# Patient Record
Sex: Male | Born: 1940
Health system: Southern US, Community
[De-identification: ages and names within clinical notes are randomized; demographics above are authoritative.]

## PROBLEM LIST (undated history)

## (undated) DIAGNOSIS — E785 Hyperlipidemia, unspecified: Secondary | ICD-10-CM

## (undated) DIAGNOSIS — Z9189 Other specified personal risk factors, not elsewhere classified: Secondary | ICD-10-CM

## (undated) DIAGNOSIS — Z9289 Personal history of other medical treatment: Secondary | ICD-10-CM

## (undated) DIAGNOSIS — G43909 Migraine, unspecified, not intractable, without status migrainosus: Secondary | ICD-10-CM

## (undated) DIAGNOSIS — M1990 Unspecified osteoarthritis, unspecified site: Secondary | ICD-10-CM

## (undated) DIAGNOSIS — E079 Disorder of thyroid, unspecified: Secondary | ICD-10-CM

## (undated) HISTORY — PX: ROTATOR CUFF REPAIR: SHX139

## (undated) HISTORY — PX: TREATMENT FISTULA ANAL: SUR1390

## (undated) HISTORY — DX: Unspecified osteoarthritis, unspecified site: M19.90

## (undated) HISTORY — DX: Migraine, unspecified, not intractable, without status migrainosus: G43.909

## (undated) HISTORY — PX: EYE SURGERY: SHX253

## (undated) HISTORY — PX: SHOULDER SURGERY: SHX246

## (undated) HISTORY — DX: Personal history of other medical treatment: Z92.89

## (undated) HISTORY — DX: Other specified personal risk factors, not elsewhere classified: Z91.89

## (undated) HISTORY — DX: Hyperlipidemia, unspecified: E78.5

## (undated) HISTORY — DX: Disorder of thyroid, unspecified: E07.9

---

## 2009-09-06 ENCOUNTER — Encounter: Admission: RE | Admit: 2009-09-06 | Discharge: 2009-09-06 | Payer: Self-pay | Admitting: Family Medicine

## 2013-05-30 ENCOUNTER — Other Ambulatory Visit: Payer: Self-pay | Admitting: Physician Assistant

## 2013-05-30 DIAGNOSIS — R1013 Epigastric pain: Secondary | ICD-10-CM

## 2013-05-31 ENCOUNTER — Ambulatory Visit
Admission: RE | Admit: 2013-05-31 | Discharge: 2013-05-31 | Disposition: A | Discharge status: Home / Self Care | Payer: 59 | Source: Ambulatory Visit | Attending: Physician Assistant | Admitting: Physician Assistant

## 2013-05-31 DIAGNOSIS — R1013 Epigastric pain: Secondary | ICD-10-CM

## 2013-06-07 ENCOUNTER — Ambulatory Visit (INDEPENDENT_AMBULATORY_CARE_PROVIDER_SITE_OTHER): Payer: 59 | Admitting: Physician Assistant

## 2013-06-07 ENCOUNTER — Other Ambulatory Visit: Payer: Self-pay | Admitting: *Deleted

## 2013-06-07 DIAGNOSIS — R9439 Abnormal result of other cardiovascular function study: Secondary | ICD-10-CM

## 2013-06-07 DIAGNOSIS — R0602 Shortness of breath: Secondary | ICD-10-CM

## 2013-06-07 NOTE — Progress Notes (Signed)
Exercise Treadmill Test  Gary Willis is a 73 y.o. male with a hx of HTN, HL, strong FHx of CAD referred by his PCP for ETT to evaluate chest/epigastric pain. Symptoms are notable with positional changes.  He notes occasional DOE.  No syncope.  Exam unremarkable.  ECG without ischemic changes.  Pre-Exercise Testing Evaluation Rhythm: normal sinus  Rate: 67 bpm     Test  Exercise Tolerance Test Ordering MD: Sherryl Manges, MD  Interpreting MD: Tereso Newcomer, PA-C  Unique Test No: 1  Treadmill:  1  Indication for ETT: exertional dyspnea  Contraindication to ETT: No   Stress Modality: exercise - treadmill  Cardiac Imaging Performed: non   Protocol: standard Bruce - maximal  Max BP:  195/76  Max MPHR (bpm):  149 85% MPR (bpm):  127  MPHR obtained (bpm):  136 % MPHR obtained:  91  Reached 85% MPHR (min:sec):  7:15 Total Exercise Time (min-sec):  9:00  Workload in METS:  10.1 Borg Scale: 13  Reason ETT Terminated:  desired heart rate attained    ST Segment Analysis At Rest: normal ST segments - no evidence of significant ST depression With Exercise: borderline ST changes  Other Information Arrhythmia:  No Angina during ETT:  absent (0) Quality of ETT:  indeterminate  ETT Interpretation:  borderline (indeterminate) with non-specific ST changes  Comments: Good exercise capacity. No chest pain. Normal BP response to exercise. There was borderline ST depression (1 mm) in the lateral leads at peak exercise.   Recommendations: Discussed with patient. Recommend nuclear stress test. We will schedule ETT-Myoview. Signed,  Tereso Newcomer, PA-C   06/07/2013 11:27 AM

## 2013-06-10 ENCOUNTER — Ambulatory Visit (HOSPITAL_COMMUNITY): Payer: Medicare Other | Attending: Cardiology | Admitting: Radiology

## 2013-06-10 VITALS — BP 126/72 | HR 52 | Ht 67.0 in | Wt 190.0 lb

## 2013-06-10 DIAGNOSIS — R079 Chest pain, unspecified: Secondary | ICD-10-CM | POA: Insufficient documentation

## 2013-06-10 DIAGNOSIS — R0609 Other forms of dyspnea: Secondary | ICD-10-CM | POA: Insufficient documentation

## 2013-06-10 DIAGNOSIS — R0989 Other specified symptoms and signs involving the circulatory and respiratory systems: Secondary | ICD-10-CM | POA: Insufficient documentation

## 2013-06-10 DIAGNOSIS — R9439 Abnormal result of other cardiovascular function study: Secondary | ICD-10-CM

## 2013-06-10 DIAGNOSIS — R0602 Shortness of breath: Secondary | ICD-10-CM

## 2013-06-10 MED ORDER — TECHNETIUM TC 99M SESTAMIBI GENERIC - CARDIOLITE
33.0000 | Freq: Once | INTRAVENOUS | Status: AC | PRN
  Administered 2013-06-10: 33 via INTRAVENOUS

## 2013-06-10 MED ORDER — TECHNETIUM TC 99M SESTAMIBI GENERIC - CARDIOLITE
11.0000 | Freq: Once | INTRAVENOUS | Status: AC | PRN
  Administered 2013-06-10: 11 via INTRAVENOUS

## 2013-06-10 NOTE — Progress Notes (Signed)
[facility] SITE 3 NUCLEAR MED [address] [place], [place] [address] [phone]    Cardiology Nuclear Med Study  Gary Willis is a 73 y.o. male     MRN : [id number]     DOB: 1940-04-22  Procedure Date: 06/10/2013  Nuclear Med Background Indication for Stress Test:  Evaluation for Ischemia History:  no known hx CAD; 06/07/13 borderline GXT Cardiac Risk Factors: Family History - CAD and Hypertension  Symptoms:  Chest Pain and DOE   Nuclear Pre-Procedure Caffeine/Decaff Intake:  None NPO After: 9:00am   Lungs:  clear O2 Sat: 97% on room air. IV 0.9% NS with Angio Cath:  20g  IV Site: R Hand  IV Started by:  Doyne Keel, CNMT  Chest Size (in):  46 Cup Size: n/a  Height: 5\' 7"  (1.702 m)  Weight:  190 lb (86.183 kg)  BMI:  Body mass index is 29.75 kg/(m^2). Tech Comments:  No am meds    Nuclear Med Study 1 or 2 day study: 1 day  Stress Test Type:  Stress  Reading MD: Kristeen Miss, MD  Order Authorizing Provider:  K. Clelia Croft, MD; Wende Mott, PA-C  Resting Radionuclide: Technetium 73m Sestamibi  Resting Radionuclide Dose: 11.0 mCi   Stress Radionuclide:  Technetium 74m Sestamibi  Stress Radionuclide Dose: 33.0 mCi           Stress Protocol Rest HR: 52 Stress HR: 144  Rest BP: 126/72 Stress BP: 164/53  Exercise Time (min): 10:30 METS: 12.5           Dose of Adenosine (mg):  n/a Dose of Lexiscan: n/a mg  Dose of Atropine (mg): n/a Dose of Dobutamine: n/a mcg/kg/min (at max HR)  Stress Test Technologist: Nelson Chimes, BS-ES  Nuclear Technologist:  Domenic Polite, CNMT     Rest Procedure:  Myocardial perfusion imaging was performed at rest 45 minutes following the intravenous administration of Technetium 52m Sestamibi. Rest ECG: NSR - Normal EKG  Stress Procedure:  The patient exercised on the treadmill utilizing the Bruce Protocol for 10:30 minutes. The patient stopped due to fatigue and had a 1/10 right sided chest pain that radiated to his left side.   Technetium 90m Sestamibi was injected at peak exercise and myocardial perfusion imaging was performed after a brief delay. Stress ECG: No significant change from baseline ECG  QPS Raw Data Images:  Mild diaphragmatic attenuation.  Normal left ventricular size. Stress Images:  There is  a small, mild area of attenuation in the inferior lateral base with normal uptake in the other regions.   Rest Images:  There is  a small, mild area of attenuation in the inferior lateral base with normal uptake in the other regions. Subtraction (SDS):  No evidence of ischemia. Transient Ischemic Dilatation (Normal <1.22):  0.98 Lung/Heart Ratio (Normal <0.45):  0.40  Quantitative Gated Spect Images QGS EDV:  82 ml QGS ESV:  30 ml  Impression Exercise Capacity:  Good exercise capacity. BP Response:  Normal blood pressure response. Clinical Symptoms:  No significant symptoms noted. ECG Impression:  mild ST depression  Comparison with Prior Nuclear Study: No images to compare  Overall Impression:  Low risk stress nuclear study .  There is a small, mild defect in the inferiolateral base that is likely due to diaphragmatic attenuation.    .  LV Ejection Fraction: 63%.  LV Wall Motion:  NL LV Function; NL Wall Motion.  The contractility in the inferiolateral base is normal.  Vesta MixerPhilip J. Nahser, Montez HagemanJr., MD, [facility]burgFACC 06/10/2013, 4:34 PM [address],  [address] Office [phone]- [phone] Pager [phone]

## 2013-06-13 ENCOUNTER — Encounter: Payer: Self-pay | Admitting: Physician Assistant

## 2013-06-15 ENCOUNTER — Other Ambulatory Visit: Payer: Self-pay | Admitting: Family Medicine

## 2013-06-15 DIAGNOSIS — I779 Disorder of arteries and arterioles, unspecified: Secondary | ICD-10-CM

## 2013-06-22 ENCOUNTER — Ambulatory Visit
Admission: RE | Admit: 2013-06-22 | Discharge: 2013-06-22 | Disposition: A | Discharge status: Home / Self Care | Payer: Medicare Other | Source: Ambulatory Visit | Attending: Family Medicine | Admitting: Family Medicine

## 2013-06-22 DIAGNOSIS — I779 Disorder of arteries and arterioles, unspecified: Secondary | ICD-10-CM

## 2013-07-09 ENCOUNTER — Encounter (HOSPITAL_COMMUNITY): Payer: Self-pay | Admitting: Emergency Medicine

## 2013-07-09 ENCOUNTER — Emergency Department (HOSPITAL_COMMUNITY)
Admission: EM | Admit: 2013-07-09 | Discharge: 2013-07-09 | Disposition: A | Discharge status: Home / Self Care | Payer: Medicare Other | Attending: Emergency Medicine | Admitting: Emergency Medicine

## 2013-07-09 DIAGNOSIS — Z9189 Other specified personal risk factors, not elsewhere classified: Secondary | ICD-10-CM | POA: Insufficient documentation

## 2013-07-09 DIAGNOSIS — R040 Epistaxis: Secondary | ICD-10-CM | POA: Insufficient documentation

## 2013-07-09 DIAGNOSIS — Z79899 Other long term (current) drug therapy: Secondary | ICD-10-CM | POA: Insufficient documentation

## 2013-07-09 NOTE — ED Notes (Signed)
Patient refused wheelchair. Walked out to waiting room by this Charity fundraiserN.

## 2013-07-09 NOTE — Discharge Instructions (Signed)
Nosebleed  Nosebleeds can be caused by many conditions including trauma, infections, polyps, foreign bodies, dry mucous membranes or climate, medications and air conditioning. Most nosebleeds occur in the front of the nose. It is because of this location that most nosebleeds can be controlled by pinching the nostrils gently and continuously. Do this for at least 10 to 20 minutes. The reason for this long continuous pressure is that you must hold it long enough for the blood to clot. If during that 10 to 20 minute time period, pressure is released, the process may have to be started again. The nosebleed may stop by itself, quit with pressure, need concentrated heating (cautery) or stop with pressure from packing.  HOME CARE INSTRUCTIONS    If your nose was packed, try to maintain the pack inside until your caregiver removes it. If a gauze pack was used and it starts to fall out, gently replace or cut the end off. Do not cut if a balloon catheter was used to pack the nose. Otherwise, do not remove unless instructed.   Avoid blowing your nose for 12 hours after treatment. This could dislodge the pack or clot and start bleeding again.   If the bleeding starts again, sit up and bending forward, gently pinch the front half of your nose continuously for 20 minutes.   If bleeding was caused by dry mucous membranes, cover the inside of your nose every morning with a petroleum or antibiotic ointment. Use your little fingertip as an applicator. Do this as needed during dry weather. This will keep the mucous membranes moist and allow them to heal.   Maintain humidity in your home by using less air conditioning or using a humidifier.   Do not use aspirin or medications which make bleeding more likely. Your caregiver can give you recommendations on this.   Resume normal activities as able but try to avoid straining, lifting or bending at the waist for several days.   If the nosebleeds become recurrent and the cause is  unknown, your caregiver may suggest laboratory tests.  SEEK IMMEDIATE MEDICAL CARE IF:    Bleeding recurs and cannot be controlled.   There is unusual bleeding from or bruising on other parts of the body.   You have a fever.   Nosebleeds continue.   There is any worsening of the condition which originally brought you in.   You become lightheaded, feel faint, become sweaty or vomit blood.  MAKE SURE YOU:    Understand these instructions.   Will watch your condition.   Will get help right away if you are not doing well or get worse.  Document Released: 10/09/2004 Document Revised: 03/24/2011 Document Reviewed: 12/01/2008  ExitCare Patient Information 2015 [employer]. This information is not intended to replace advice given to you by your health care provider. Make sure you discuss any questions you have with your health care provider.

## 2013-07-09 NOTE — ED Provider Notes (Signed)
CSN: [id number]     Arrival date & time 07/09/13  1114 History   First MD Initiated Contact with Patient 07/09/13 1158     Chief Complaint  Patient presents with  . Epistaxis     (Consider location/radiation/quality/duration/timing/severity/associated sxs/prior Treatment) HPI  72yM with epistaxis. Onset this morning. R nare. Denies trauma. No hx of recurrent nose bleed. On aspirin, otherwise no blood thinners. No dizziness, lightheadedness or sob. Has not stopped despite holding pressure. Feels blood in back of throat and is spitting it up.   Past Medical History  Diagnosis Date  . Hx of cardiovascular stress test     ETT-Myoview (05/2013):  diaph atten, no ischemia, EF 63%; Low Risk   History reviewed. No pertinent past surgical history. No family history on file. History  Substance Use Topics  . Smoking status: Never Smoker   . Smokeless tobacco: Not on file  . Alcohol Use: Yes     Comment: social    Review of Systems  All systems reviewed and negative, other than as noted in HPI.   Allergies  Review of patient's allergies indicates no known allergies.  Home Medications   Prior to Admission medications   Medication Sig Start Date End Date Taking? Authorizing Provider  co-enzyme Q-10 30 MG capsule Take 30 mg by mouth every morning.   Yes Historical Provider, MD  dutasteride (AVODART) 0.5 MG capsule Take 0.5 mg by mouth every evening.   Yes Historical Provider, MD  glucosamine-chondroitin 500-400 MG tablet Take 1 tablet by mouth 2 (two) times daily.   Yes Historical Provider, MD  lisinopril (PRINIVIL,ZESTRIL) 10 MG tablet Take 10 mg by mouth every evening.   Yes Historical Provider, MD  Multiple Vitamins-Minerals (MULTIVITAMIN WITH MINERALS) tablet Take 1 tablet by mouth every morning.   Yes Historical Provider, MD  Omega-3 Fatty Acids (FISH OIL PO) Take 1 tablet by mouth 2 (two) times daily.   Yes Historical Provider, MD  Red Yeast Rice Extract (RED YEAST RICE PO) Take  1 tablet by mouth 2 (two) times daily.   Yes Historical Provider, MD  simvastatin (ZOCOR) 20 MG tablet Take 10 mg by mouth every evening.   Yes Historical Provider, MD  sodium chloride (OCEAN) 0.65 % SOLN nasal spray Place 1-2 sprays into both nostrils daily as needed for congestion.   Yes Historical Provider, MD  tamsulosin (FLOMAX) 0.4 MG CAPS capsule Take 0.4 mg by mouth every evening.   Yes Historical Provider, MD   BP 133/70  Pulse 75  Temp(Src) 97.8 F (36.6 C) (Oral)  Resp 16  SpO2 96% Physical Exam  Nursing note and vitals reviewed. Constitutional: He appears well-developed and well-nourished. No distress.  HENT:  Head: Normocephalic.  Dried blood and clot R nare. Suctioned. Mild oozing noted, but no discrete source identified. Likely posterior. Small mount of blood posterior pharynx.   Eyes: Conjunctivae are normal. Right eye exhibits no discharge. Left eye exhibits no discharge.  Neck: Neck supple.  Cardiovascular: Normal rate, regular rhythm and normal heart sounds.  Exam reveals no gallop and no friction rub.   No murmur heard. Pulmonary/Chest: Effort normal and breath sounds normal. No respiratory distress.  Abdominal: Soft. He exhibits no distension. There is no tenderness.  Musculoskeletal: He exhibits no edema and no tenderness.  Neurological: He is alert.  Skin: Skin is warm and dry.  Psychiatric: He has a normal mood and affect. His behavior is normal. Thought content normal.    ED Course  EPISTAXIS MANAGEMENT Date/Time:  07/09/2013 11:50 AM Performed by: Raeford RazorKOHUT, Nita Whitmire Authorized by: Raeford RazorKOHUT, Garek Schuneman Consent: Verbal consent obtained. Risks and benefits: risks, benefits and alternatives were discussed Consent given by: patient Patient sedated: no Treatment site: right posterior Repair method: suction Post-procedure assessment: bleeding stopped Treatment complexity: simple Recurrence: recurrence of recent bleed Patient tolerance: Patient tolerated the procedure  well with no immediate complications.   (including critical care time) Labs Review Labs Reviewed - No data to display  Imaging Review No results found.   EKG Interpretation None      MDM   Final diagnoses:  Epistaxis    72yM with nose bleed. No dizziness, lightheadedness, dyspnea, etc.  Likely posterior. Luckily stopped with administration of afrin. Checked at 10 minutes and again at 30 after w/o reoccurrence. He no longer feels it in back of his throat and clear on exam.      Raeford RazorStephen Diana Armijo, MD 07/09/13 1238

## 2013-07-09 NOTE — ED Notes (Signed)
Pt discharged to home with family. NAD.  

## 2013-07-09 NOTE — ED Notes (Signed)
Pt presents to department for evaluation of nosebleed. Onset this morning. Pt states R sided nosebleed, unable to get stopped at home. Currently taking aspirin. Pt is alert and oriented x4.

## 2015-04-08 IMAGING — US US CAROTID DUPLEX BILAT
1 series · 13 of 24 positions shown · non-contrast
Comparison: None.

CLINICAL DATA: History of carotid stenosis, hypertension and
hyperlipidemia.

EXAM:
BILATERAL CAROTID DUPLEX ULTRASOUND
TECHNIQUE: Gray scale imaging, color Doppler and duplex ultrasound were
performed of bilateral carotid and vertebral arteries in the neck.

[Series 1: us carotid duplex bilat · 0.08mm/px · 13 of 63 slices shown]
[im 1/63]
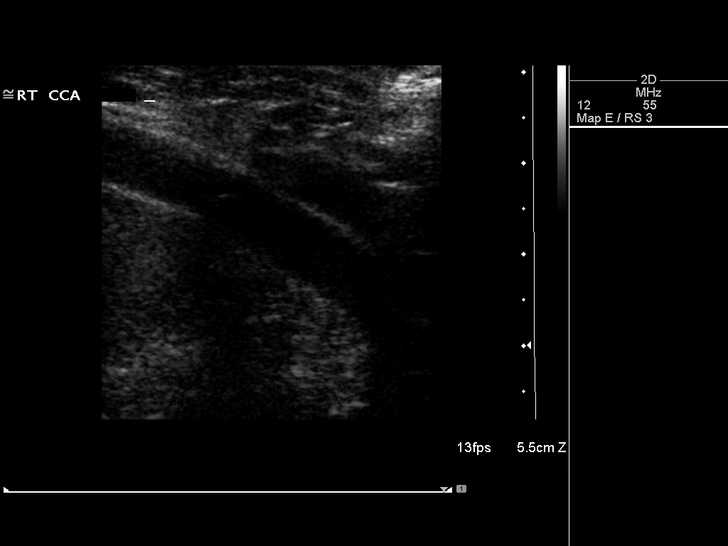
[im 6/63]
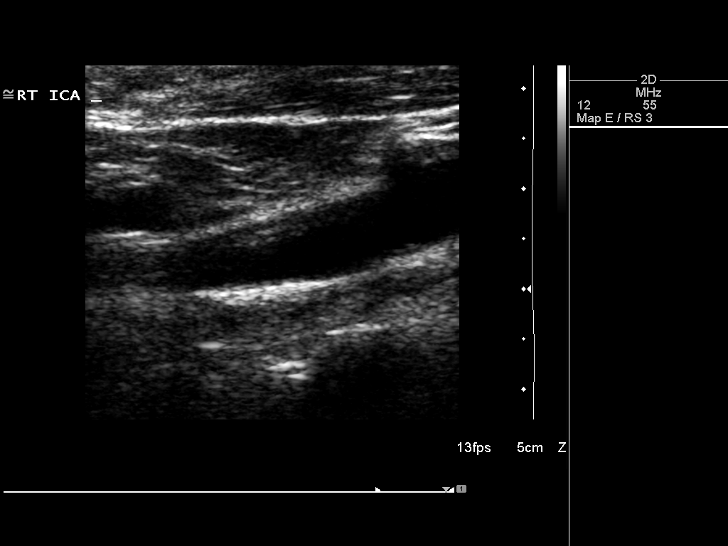
[im 11/63]
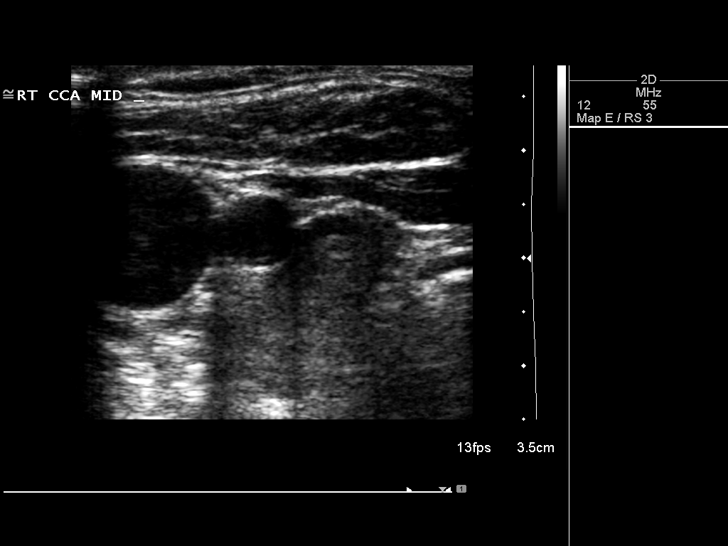
[im 17/63]
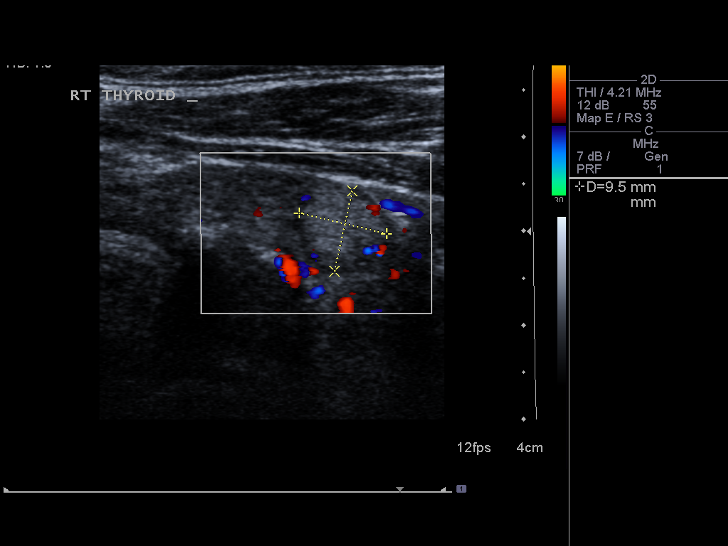
[im 22/63]
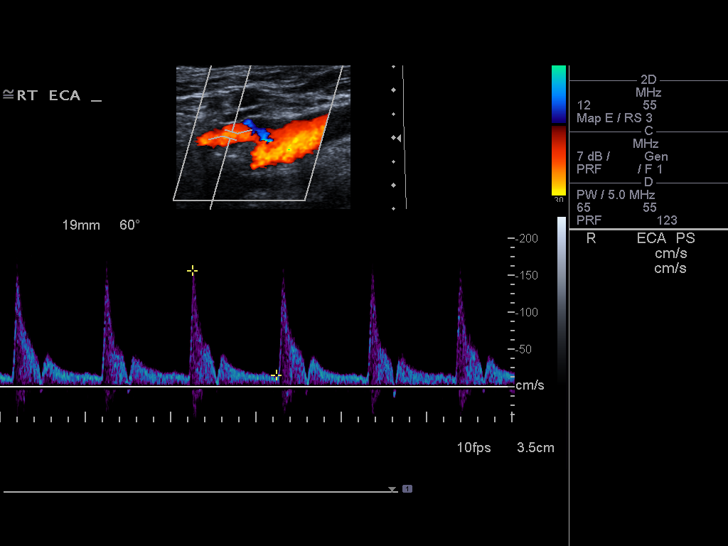
[im 27/63]
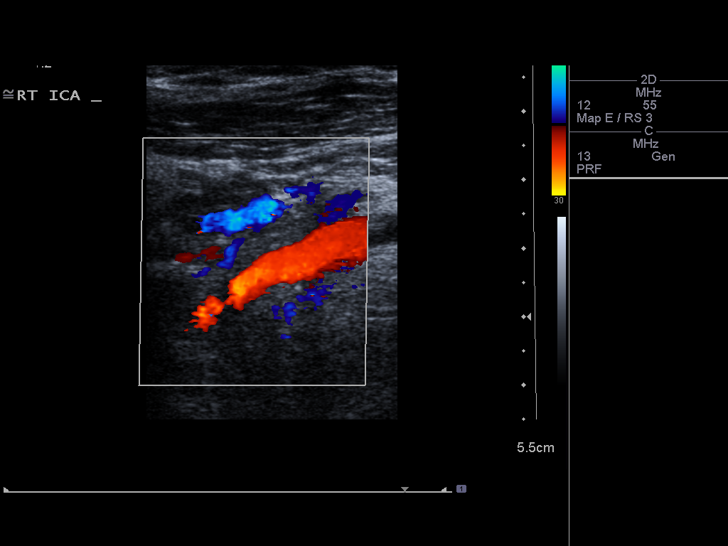
[im 33/63]
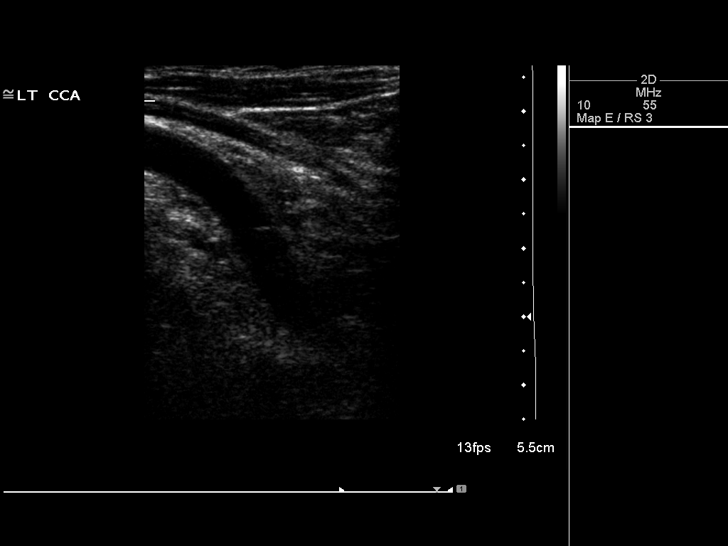
[im 36/63]
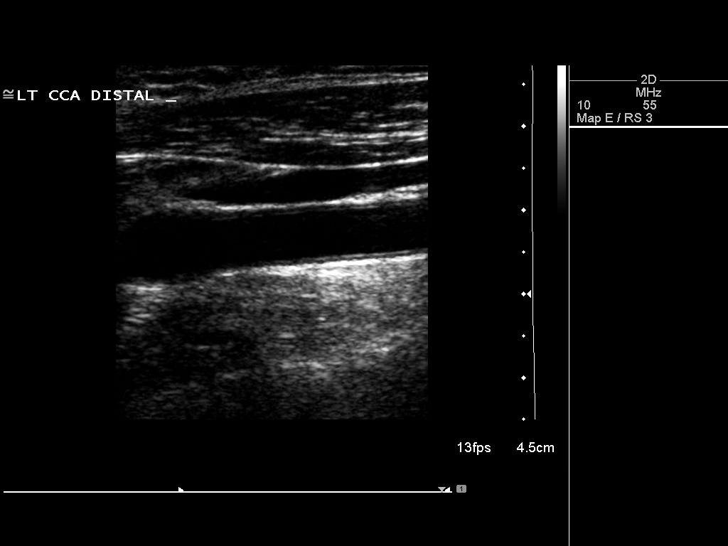
[im 41/63]
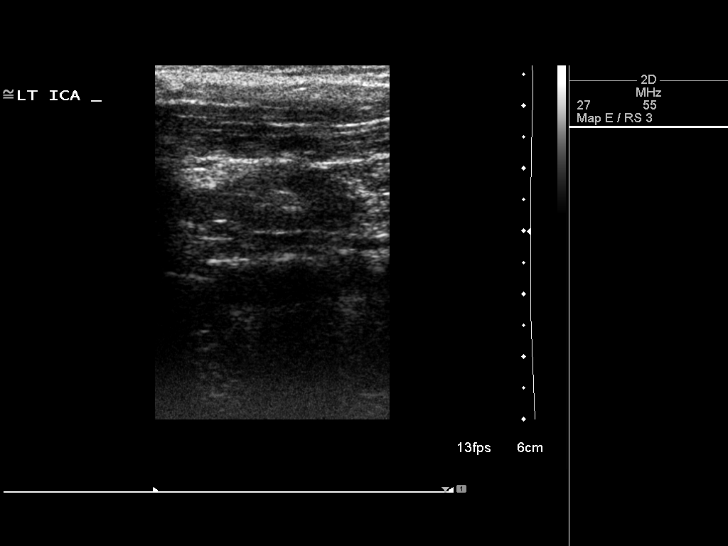
[im 46/63]
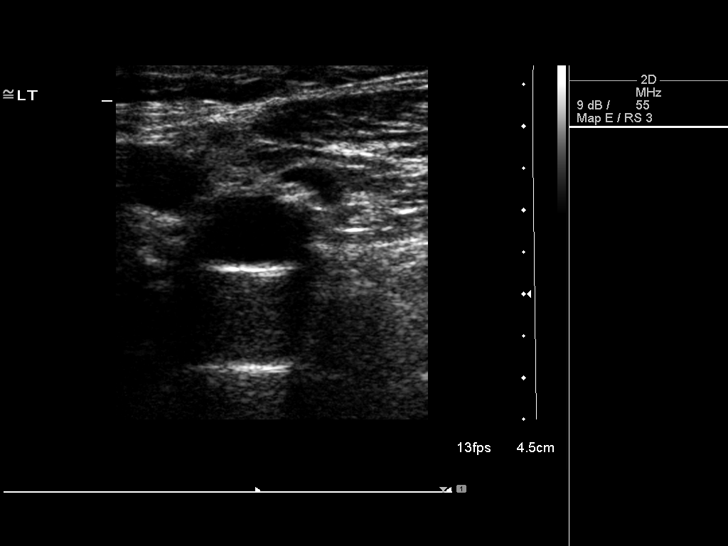
[im 52/63]
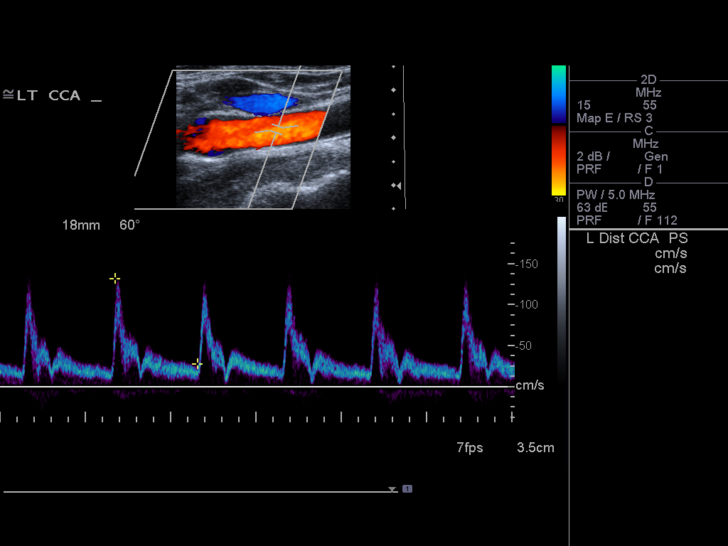
[im 57/63]
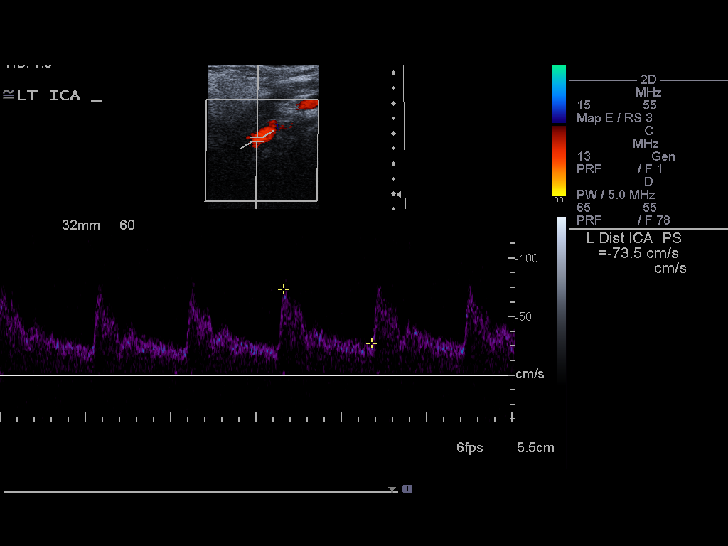
[im 63/63]
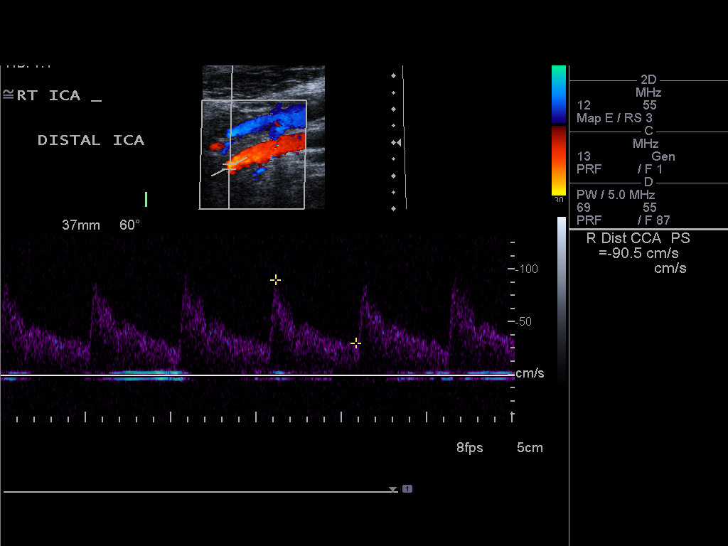

[13 of 24 positions shown; findings below may reference images not displayed]

FINDINGS: Criteria: Quantification of carotid stenosis is based on velocity
parameters that correlate the residual internal carotid diameter
with NASCET-based stenosis levels, using the diameter of the distal
internal carotid lumen as the denominator for stenosis measurement.

The following velocity measurements were obtained:

RIGHT

ICA:  105/23 cm/sec

CCA:  154/32 cm/sec

SYSTOLIC ICA/CCA RATIO:

DIASTOLIC ICA/CCA RATIO:

ECA:  157 cm/sec

LEFT

ICA:  105/23 cm/sec

CCA:  149/36 cm/sec

SYSTOLIC ICA/CCA RATIO:

DIASTOLIC ICA/CCA RATIO:

ECA:  115 cm/sec

RIGHT CAROTID ARTERY: Mild mixed plaque is noted at the level of the
carotid bulb and proximal ICA. Velocities and waveforms are within
normal limits. Estimated right ICA stenosis less than 50%.

RIGHT VERTEBRAL ARTERY: Antegrade flow with normal waveform and
velocity.

LEFT CAROTID ARTERY: Mild amount of mixed plaque is present at the
level of the carotid bulb and extending up to the ICA origin.
Velocities and waveforms are normal and estimated left ICA stenosis
is less than 50%.

LEFT VERTEBRAL ARTERY: Antegrade flow with normal waveform and
velocity.
IMPRESSION: Mild atherosclerotic plaque at both carotid bifurcations. Estimated
bilateral ICA stenoses are less than 50%.

## 2015-08-27 ENCOUNTER — Other Ambulatory Visit: Payer: Self-pay | Admitting: Family Medicine

## 2015-08-27 DIAGNOSIS — I779 Disorder of arteries and arterioles, unspecified: Secondary | ICD-10-CM

## 2015-08-27 DIAGNOSIS — I739 Peripheral vascular disease, unspecified: Principal | ICD-10-CM

## 2015-09-06 ENCOUNTER — Ambulatory Visit
Admission: RE | Admit: 2015-09-06 | Discharge: 2015-09-06 | Disposition: A | Discharge status: Home / Self Care | Payer: Medicare Other | Source: Ambulatory Visit | Attending: Family Medicine | Admitting: Family Medicine

## 2015-09-06 DIAGNOSIS — I779 Disorder of arteries and arterioles, unspecified: Secondary | ICD-10-CM

## 2015-09-06 DIAGNOSIS — I739 Peripheral vascular disease, unspecified: Principal | ICD-10-CM

## 2015-10-02 ENCOUNTER — Other Ambulatory Visit: Payer: Self-pay | Admitting: Physician Assistant

## 2015-10-02 ENCOUNTER — Ambulatory Visit
Admission: RE | Admit: 2015-10-02 | Discharge: 2015-10-02 | Disposition: A | Discharge status: Home / Self Care | Payer: Medicare Other | Source: Ambulatory Visit | Attending: Physician Assistant | Admitting: Physician Assistant

## 2015-10-02 DIAGNOSIS — T1490XA Injury, unspecified, initial encounter: Secondary | ICD-10-CM

## 2016-06-11 LAB — HM COLONOSCOPY

## 2016-09-11 ENCOUNTER — Other Ambulatory Visit: Payer: Self-pay | Admitting: Family Medicine

## 2016-09-11 DIAGNOSIS — I739 Peripheral vascular disease, unspecified: Principal | ICD-10-CM

## 2016-09-11 DIAGNOSIS — I779 Disorder of arteries and arterioles, unspecified: Secondary | ICD-10-CM

## 2016-09-17 ENCOUNTER — Ambulatory Visit
Admission: RE | Admit: 2016-09-17 | Discharge: 2016-09-17 | Disposition: A | Discharge status: Home / Self Care | Payer: Medicare Other | Source: Ambulatory Visit | Attending: Family Medicine | Admitting: Family Medicine

## 2016-09-17 DIAGNOSIS — I739 Peripheral vascular disease, unspecified: Principal | ICD-10-CM

## 2016-09-17 DIAGNOSIS — I779 Disorder of arteries and arterioles, unspecified: Secondary | ICD-10-CM

## 2017-06-11 ENCOUNTER — Encounter: Payer: Self-pay | Admitting: Sports Medicine

## 2017-06-11 ENCOUNTER — Ambulatory Visit: Payer: Medicare Other | Admitting: Sports Medicine

## 2017-06-11 ENCOUNTER — Ambulatory Visit: Payer: Self-pay

## 2017-06-11 VITALS — BP 140/72 | HR 74 | Ht 67.0 in | Wt 187.0 lb

## 2017-06-11 DIAGNOSIS — M67922 Unspecified disorder of synovium and tendon, left upper arm: Secondary | ICD-10-CM

## 2017-06-11 DIAGNOSIS — G2589 Other specified extrapyramidal and movement disorders: Secondary | ICD-10-CM | POA: Diagnosis not present

## 2017-06-11 DIAGNOSIS — M75102 Unspecified rotator cuff tear or rupture of left shoulder, not specified as traumatic: Secondary | ICD-10-CM

## 2017-06-11 DIAGNOSIS — Z8669 Personal history of other diseases of the nervous system and sense organs: Secondary | ICD-10-CM

## 2017-06-11 DIAGNOSIS — M25512 Pain in left shoulder: Secondary | ICD-10-CM | POA: Diagnosis not present

## 2017-06-11 DIAGNOSIS — G8929 Other chronic pain: Secondary | ICD-10-CM | POA: Diagnosis not present

## 2017-06-11 MED ORDER — NITROGLYCERIN 0.2 MG/HR TD PT24: MEDICATED_PATCH | TRANSDERMAL | 1 refills | Status: DC

## 2017-06-11 NOTE — Procedures (Signed)
LIMITED MSK ULTRASOUND OF Left shoulder Images were obtained and interpreted by myself, Gaspar Bidding, DO  Images have been saved and stored to PACS system. Images obtained on: GE S7 Ultrasound machine  FINDINGS:  Biceps Tendon: Positive halo sign, appears to be intact.  No significant fraying however there is thickening. Pec Major Insertion: Normal Subscapularis Tendon: Normal Supraspinatus Tendon: Moderate thickening with possible small interstitial tear and small subacromial bursa Infraspinatus/Teres Minor Tendon: Normal AC Joint: Small mushroom sign.  No pain with sono palpation JOINT: No significant GH spurring appreciated LABRUM: Not evaluated   IMPRESSION:  1. Biceps Tendinitis 2. Supraspinatus tendinopathy

## 2017-06-11 NOTE — Progress Notes (Signed)
Gary Willis. Delorise Willis Sports Medicine [facility] at [facility] [phone]  Gary Willis - 77 y.o. male MRN [id number]  Date of birth: 06-30-1940  Visit Date: 06/11/2017  PCP: Lupita Raider, MD   Referred by: Lupita Raider, MD  Scribe for today's visit: Christoper Fabian, LAT, ATC     SUBJECTIVE:  Gary Willis is here for New Patient (Initial Visit) (L shoulder pain) .   His L shoulder pain symptoms INITIALLY: Began in Feb. 2019 and started lifting weights.  He states that he feels like he might have overdone it at the gym.  He notes that he con't to have pain but is able to use his L arm better at this point.  The pain began in the L deltoid and L posterior shoulder.  He reports that he likes to play golf and isn't able to play w/o pain at this point. Described as mild-moderate aching pain depending on activity , nonradiating Worsened with reaching behind your back, L shoulder AROM into aBd, resistive activity w/ the L arm, golf swing follow-through Improved with rest, ice, stopping irritating activity Additional associated symptoms include: no mechanical symptoms noted and no N/T into the L UE    At this time symptoms are improving compared to onset with decreased pain. He has been taking IBU prn.  He takes tumeric bid.  He initially was icing his shoulder but isn't doing that at this point.  ROS Reports night time disturbances. Denies fevers, chills, or night sweats. Denies unexplained weight loss. Denies personal history of cancer. Denies changes in bowel or bladder habits. Denies recent unreported falls. Denies new or worsening dyspnea or wheezing. Denies headaches or dizziness.  Denies numbness, tingling or weakness  In the extremities.  Denies dizziness or presyncopal episodes Denies lower extremity edema    HISTORY & PERTINENT PRIOR DATA:  Prior History reviewed and updated per electronic medical record.  Significant/pertinent history,  findings, studies include:  reports that he has never smoked. He has quit using smokeless tobacco. His smokeless tobacco use included chew. No results for input(s): HGBA1C, LABURIC, CREATINE in the last 8760 hours. No specialty comments available. No problems updated.  OBJECTIVE:  VS:  HT:5\' 7"  (170.2 cm)   WT:187 lb (84.8 kg)  BMI:29.28    BP:140/72  HR:74bpm  TEMP: ( )  RESP:95 %   PHYSICAL EXAM: Constitutional: WDWN, Non-toxic appearing. Psychiatric: Alert & appropriately interactive.  Not depressed or anxious appearing. Respiratory: No increased work of breathing.  Trachea Midline Eyes: Pupils are equal.  EOM intact without nystagmus.  No scleral icterus  Vascular Exam: warm to touch no edema  upper extremity neuro exam: unremarkable normal sensation  MSK Exam: Left shoulder held in slight protraction.  He has slight limited abduction and forward flexion by 10 to 15 degrees.  Minimal pain with axial load and circumduction without significant crepitation.  Small amount of pain directly over the biceps as well as midportion of the deltoid but no appreciable palpable defect.  He has marked pain with speeds testing.  Moderate pain with empty can testing and weakness of 4 out of 5 with both of these.  O'Brien's testing is pain-free.   ASSESSMENT & PLAN:   1. Chronic left shoulder pain   2. Rotator cuff syndrome of left shoulder   3. Scapular dyskinesis   4. Hx of migraines   5. Tendinopathy of left biceps tendon     PLAN: Rotator cuff tendinopathy as well  as biceps tendinosis and acute tendinitis.  We will plan to have him start on nitroglycerin protocol given the increased risk of rupture with corticosteroid injection but this was offered as well and he would like to defer.  Scapular dyskinesis is an underlying issue for him as well with scapular protraction we will have him begin working on therapeutic exercises as below.  Discussed options with the patient today including  biologic treatment with topical nitroglycerin. Patient has no contraindications & understands the risks, benefits and intentions of treatment. Emphasized the importance of rotating sites as well as appropriate and expected adverse reactions including orthostasis, headache, adhesive sensitivity.  Begin with 1/4 patch to the affected area. Okay to titrate to half a patch as tolerated.  Discussed the foundation of treatment for this condition is physical therapy and/or daily (5-6 days/week) therapeutic exercises, focusing on core strengthening, coordination, neuromuscular control/reeducation.  Therapeutic exercises prescribed per procedure note.  Follow-up: Return in about 6 weeks (around 07/23/2017).       Please see additional documentation for Objective, Assessment and Plan sections. Pertinent additional documentation may be included in corresponding procedure notes, imaging studies, problem based documentation and patient instructions. Please see these sections of the encounter for additional information regarding this visit.  CMA/ATC served as Neurosurgeon during this visit. History, Physical, and Plan performed by medical provider. Documentation and orders reviewed and attested to.      Andrena Mews, DO     Sports Medicine Physician

## 2017-06-11 NOTE — Patient Instructions (Addendum)
Nitroglycerin Protocol   Apply 1/4 nitroglycerin patch to affected area daily.  Change position of patch within the affected area every 24 hours.  You may experience a headache during the first 1-2 weeks of using the patch, these should subside.  If you experience headaches after beginning nitroglycerin patch treatment, you may take your preferred over the counter pain reliever.  Another side effect of the nitroglycerin patch is skin irritation or rash related to patch adhesive.  Please notify our office if you develop more severe headaches or rash, and stop the patch.  Tendon healing with nitroglycerin patch may require 12 to 24 weeks depending on the extent of injury.  Men should not use if taking Viagra, Cialis, or Levitra.   Do not use if you have migraines or rosacea.   Please perform the exercise program that we have prepared for you and gone over in detail on a daily basis.  In addition to the handout you were provided you can access your program through: [email or web address]   Your unique program code is:  [email or web address]

## 2017-06-11 NOTE — Progress Notes (Signed)
PROCEDURE NOTE: THERAPEUTIC EXERCISES (97110) 15 minutes spent for Therapeutic exercises as below and as referenced in the AVS.  This included exercises focusing on stretching, strengthening, with significant focus on eccentric aspects.   Proper technique shown and discussed handout in great detail with ATC.  All questions were discussed and answered.   Long term goals include an improvement in range of motion, strength, endurance as well as avoiding reinjury. Frequency of visits is one time as determined during today's  office visit. Frequency of exercises to be performed is as per handout.  EXERCISES REVIEWED:  Intrinsic Rotator Cuff Exercises  Scapular Stabilization  Bicep curls

## 2017-07-23 ENCOUNTER — Ambulatory Visit: Payer: Medicare Other | Admitting: Sports Medicine

## 2017-07-23 ENCOUNTER — Encounter: Payer: Self-pay | Admitting: Sports Medicine

## 2017-07-23 VITALS — BP 122/70 | HR 58 | Ht 67.0 in | Wt 187.8 lb

## 2017-07-23 DIAGNOSIS — M25512 Pain in left shoulder: Secondary | ICD-10-CM

## 2017-07-23 DIAGNOSIS — M75102 Unspecified rotator cuff tear or rupture of left shoulder, not specified as traumatic: Secondary | ICD-10-CM

## 2017-07-23 DIAGNOSIS — G2589 Other specified extrapyramidal and movement disorders: Secondary | ICD-10-CM | POA: Diagnosis not present

## 2017-07-23 DIAGNOSIS — G8929 Other chronic pain: Secondary | ICD-10-CM

## 2017-07-23 NOTE — Progress Notes (Signed)
[place]. [facility] [phone]  DEVAUNTE GASPARINI - 77 y.o. male MRN [id number]  Date of birth: May 02, 1940  Visit Date: 07/23/2017  PCP: Lupita Raider, MD   Referred by: Lupita Raider, MD  Scribe(s) for today's visit: Stevenson Clinch, CMA  SUBJECTIVE:  [place] is here for No chief complaint on file.   06/11/2017: His L shoulder pain symptoms INITIALLY: Began in Feb. 2019 and started lifting weights.  He states that he feels like he might have overdone it at the gym.  He notes that he con't to have pain but is able to use his L arm better at this point.  The pain began in the L deltoid and L posterior shoulder.  He reports that he likes to play golf and isn't able to play w/o pain at this point. Described as mild-moderate aching pain depending on activity , nonradiating Worsened with reaching behind your back, L shoulder AROM into aBd, resistive activity w/ the L arm, golf swing follow-through Improved with rest, ice, stopping irritating activity Additional associated symptoms include: no mechanical symptoms noted and no N/T into the L UE   At this time symptoms are improving compared to onset with decreased pain. He has been taking IBU prn.  He takes tumeric bid.  He initially was icing his shoulder but isn't doing that at this point.  07/23/2017: Compared to the last office visit, his previously described symptoms are improving, no pain, improved ROM. He was able to play golf Tues morning with no pain. He notes that he did have some soreness in the shoulder after washing his boat and car but it has resolved.  Current symptoms are mild & are nonradiating He was having a lot of pain with internal and external rotation so he hasn't been doing HEP but plans to start now since his sx have improved. He takes IBU prn. He has been following Nitro Protocol with no side effects. He did have HA initially but that has  resolved.  He is currently taking Naproxen and on an abx s/p dental procedure. He is aware not to take Naproxen and IBU at the same time.    REVIEW OF SYSTEMS: Denies night time disturbances. Denies fevers, chills, or night sweats. Denies unexplained weight loss. Denies personal history of cancer. Denies changes in bowel or bladder habits. Denies recent unreported falls. Denies new or worsening dyspnea or wheezing. Denies headaches or dizziness.  Denies numbness, tingling or weakness  In the extremities.  Denies dizziness or presyncopal episodes Denies lower extremity edema    HISTORY & PERTINENT PRIOR DATA:  Prior History reviewed and updated per electronic medical record.  Significant/pertinent history, findings, studies include:  reports that he has never smoked. He has quit using smokeless tobacco. His smokeless tobacco use included chew. No results for input(s): HGBA1C, LABURIC, CREATINE in the last 8760 hours. No specialty comments available. No problems updated.  OBJECTIVE:  VS:  HT:5\' 7"  (170.2 cm)   WT:187 lb 12.8 oz (85.2 kg)  BMI:29.41    BP:122/70  HR:(Abnormal) 58bpm  TEMP: ( )  RESP:96 %   PHYSICAL EXAM: Constitutional: WDWN, Non-toxic appearing. Psychiatric: Alert & appropriately interactive.  Not depressed or anxious appearing. Respiratory: No increased work of breathing.  Trachea Midline Eyes: Pupils are equal.  EOM intact without nystagmus.  No scleral icterus  Vascular Exam: warm to touch no edema  upper extremity neuro exam: unremarkable  MSK Exam:  Shoulders overall held in more neutral position at last visit.  Still has a slight amount of scapular dyskinesis and slight protraction rest with minimal.  Small amount pain with external rotation and empty can testing.  Negative Hawkins and negative Neer's.   ASSESSMENT & PLAN:   1. Rotator cuff syndrome of left shoulder   2. Chronic left shoulder pain   3. Scapular dyskinesis     PLAN:  Therapeutic exercises we talked today given he has not been performing these as diligently given the small amount of discomfort he was having when first initiated them.  He is continuing to improve with pain and has only minimal symptoms with day-to-day activities.  His right shoulder is also been giving him some discomfort over the past 2 years and recommend trying the nitroglycerin patch over this as well but to be cautious of headaches and orthostasis.  >50% of this 25 minute visit spent in direct patient counseling and/or coordination of care.  Discussion was focused on education regarding the in discussing the pathoetiology and anticipated clinical course of the above condition.  Follow-up: Return in about 6 weeks (around 09/03/2017).      Please see additional documentation for Objective, Assessment and Plan sections. Pertinent additional documentation may be included in corresponding procedure notes, imaging studies, problem based documentation and patient instructions. Please see these sections of the encounter for additional information regarding this visit.  CMA/ATC served as Neurosurgeonscribe during this visit. History, Physical, and Plan performed by medical provider. Documentation and orders reviewed and attested to.      Andrena MewsMichael D Capers Hagmann, DO    [place] [facility] Phys[facility]

## 2017-07-23 NOTE — Progress Notes (Signed)

## 2017-09-24 LAB — LIPID PANEL
Cholesterol: 139 (ref 0–200)
HDL: 46 (ref 35–70)
TRIGLYCERIDES: 113 (ref 40–160)

## 2017-09-24 LAB — BASIC METABOLIC PANEL
BUN: 12 (ref 4–21)
Creatinine: 0.9 (ref 0.6–1.3)
Glucose: 101
Sodium: 138 (ref 137–147)

## 2017-09-24 LAB — HEMOGLOBIN A1C: Hemoglobin A1C: 5.9

## 2017-09-24 LAB — HEPATIC FUNCTION PANEL: Bilirubin, Total: 0.6

## 2017-12-14 ENCOUNTER — Encounter: Payer: Self-pay | Admitting: Family Medicine

## 2017-12-14 ENCOUNTER — Ambulatory Visit: Payer: Medicare Other | Admitting: Family Medicine

## 2017-12-14 VITALS — BP 124/72 | HR 72 | Temp 98.3°F | Ht 67.0 in | Wt 185.2 lb

## 2017-12-14 DIAGNOSIS — E785 Hyperlipidemia, unspecified: Secondary | ICD-10-CM | POA: Diagnosis not present

## 2017-12-14 DIAGNOSIS — K529 Noninfective gastroenteritis and colitis, unspecified: Secondary | ICD-10-CM | POA: Insufficient documentation

## 2017-12-14 DIAGNOSIS — M1990 Unspecified osteoarthritis, unspecified site: Secondary | ICD-10-CM | POA: Diagnosis not present

## 2017-12-14 DIAGNOSIS — K219 Gastro-esophageal reflux disease without esophagitis: Secondary | ICD-10-CM | POA: Insufficient documentation

## 2017-12-14 LAB — TSH: TSH: 44.39 u[IU]/mL — AB (ref 0.35–4.50)

## 2017-12-14 LAB — CBC
HEMATOCRIT: 44.5 % (ref 39.0–52.0)
Hemoglobin: 15 g/dL (ref 13.0–17.0)
MCHC: 33.7 g/dL (ref 30.0–36.0)
MCV: 87.7 fl (ref 78.0–100.0)
PLATELETS: 326 10*3/uL (ref 150.0–400.0)
RBC: 5.07 Mil/uL (ref 4.22–5.81)
RDW: 13.9 % (ref 11.5–15.5)
WBC: 10.5 10*3/uL (ref 4.0–10.5)

## 2017-12-14 LAB — COMPREHENSIVE METABOLIC PANEL
ALT: 19 U/L (ref 0–53)
AST: 24 U/L (ref 0–37)
Albumin: 4.5 g/dL (ref 3.5–5.2)
Alkaline Phosphatase: 52 U/L (ref 39–117)
BUN: 11 mg/dL (ref 6–23)
CALCIUM: 9.3 mg/dL (ref 8.4–10.5)
CHLORIDE: 103 meq/L (ref 96–112)
CO2: 27 meq/L (ref 19–32)
CREATININE: 0.93 mg/dL (ref 0.40–1.50)
GFR: 83.96 mL/min (ref >60.00)
Glucose, Bld: 83 mg/dL (ref 70–99)
POTASSIUM: 4.1 meq/L (ref 3.5–5.1)
SODIUM: 140 meq/L (ref 135–145)
Total Bilirubin: 0.5 mg/dL (ref 0.2–1.2)
Total Protein: 7.9 g/dL (ref 6.0–8.3)

## 2017-12-14 LAB — LIPASE: LIPASE: 22 U/L (ref 11.0–59.0)

## 2017-12-14 NOTE — Progress Notes (Signed)
Subjective:  Gary Willis is a 77 y.o. male who presents today with a chief complaint of diarrhea and to establish care.   HPI:  Frequent Stool, recent problem Started about 2 months ago.  Patient states he was given 2 rounds of antibiotics for concern for infectious prostatitis.,  And noticed increasing stool frequency after finishing his second course.  Symptoms worsened over last few days.  He will sometimes go 5 or 6 times per day. He has sudden urges to use the restroom.  No abdominal pain.  Stool is described as "black and sludgy".  Denies any bright red blood per rectum.  No fevers or chills.  No abdominal pain.  Had a stool sample done several weeks ago which was reportedly negative.  He has not tried any home medications.  He has tried taking kombucha as a probiotic which seems to be helping some.  Until about a week ago, he was taking ibuprofen twice daily for osteoarthritis pain.  He is on ranitidine 150 mg twice daily for reflux.  No weakness.  Dyslipidemia, chronic problem, new to provider, stable Several year history.  On Crestor 10 mg daily tolerating well.  GERD, chronic problem, new to provider, stable As noted above, he is on ranitidine 150 mg twice daily which helps control his symptoms.  Osteoarthritis, chronic problem, new to provider, stable Located multiple sites, predominantly his hand.  Symptoms have worsened after stopping ibuprofen for the past week or so.  Currently takes glucosamine-chondroitin supplements which seems to help.  ROS: Per HPI, otherwise a complete review of systems was negative.   PMH:  The following were reviewed and entered/updated in epic: Past Medical History:  Diagnosis Date  . Arthritis   . Hx of cardiovascular stress test    ETT-Myoview (05/2013):  diaph atten, no ischemia, EF 63%; Low Risk  . Hyperlipidemia   . Migraines    Patient Active Problem List   Diagnosis Date Noted  . Frequent stools 12/14/2017  . Dyslipidemia  12/14/2017  . GERD (gastroesophageal reflux disease) 12/14/2017  . Osteoarthritis 12/14/2017   Past Surgical History:  Procedure Laterality Date  . TREATMENT FISTULA ANAL      Family History  Problem Relation Age of Onset  . Lung cancer Mother   . Heart attack Father   . High Cholesterol Father   . Diabetes Sister   . Colon cancer Neg Hx     Medications- reviewed and updated Current Outpatient Medications  Medication Sig Dispense Refill  . co-enzyme Q-10 30 MG capsule Take 30 mg by mouth every morning.    Marland Kitchen. glucosamine-chondroitin 500-400 MG tablet Take 1 tablet by mouth 2 (two) times daily.    . Multiple Vitamins-Minerals (MULTIVITAMIN WITH MINERALS) tablet Take 1 tablet by mouth every morning.    . raNITIdine HCl (ACID CONTROL PO) Take by mouth.    . Red Yeast Rice Extract (RED YEAST RICE PO) Take 1 tablet by mouth 2 (two) times daily.    . rosuvastatin (CRESTOR) 20 MG tablet Take 20 mg by mouth daily.    . tamsulosin (FLOMAX) 0.4 MG CAPS capsule Take 0.4 mg by mouth 2 (two) times daily.      No current facility-administered medications for this visit.     Allergies-reviewed and updated No Known Allergies  Social History   Socioeconomic History  . Marital status: Widowed    Spouse name: Not on file  . Number of children: Not on file  . Years of education: Not  on file  . Highest education level: Not on file  Occupational History  . Not on file  Social Needs  . Financial resource strain: Not on file  . Food insecurity:    Worry: Not on file    Inability: Not on file  . Transportation needs:    Medical: Not on file    Non-medical: Not on file  Tobacco Use  . Smoking status: Never Smoker  . Smokeless tobacco: Former Neurosurgeon    Types: Chew  Substance and Sexual Activity  . Alcohol use: Yes    Alcohol/week: 1.0 standard drinks    Types: 1 Cans of beer per week    Comment: social  . Drug use: No  . Sexual activity: Not on file  Lifestyle  . Physical activity:      Days per week: Not on file    Minutes per session: Not on file  . Stress: Not on file  Relationships  . Social connections:    Talks on phone: Not on file    Gets together: Not on file    Attends religious service: Not on file    Active member of club or organization: Not on file    Attends meetings of clubs or organizations: Not on file    Relationship status: Not on file  Other Topics Concern  . Not on file  Social History Narrative  . Not on file    Objective:  Physical Exam: BP 124/72 (BP Location: Left Arm, Patient Position: Sitting, Cuff Size: Large)   Pulse 72   Temp 98.3 F (36.8 C) (Oral)   Ht 5\' 7"  (1.702 m)   Wt 185 lb 4 oz (84 kg)   SpO2 98%   BMI 29.01 kg/m   Gen: NAD, resting comfortably CV: RRR with no murmurs appreciated Pulm: NWOB, CTAB with no crackles, wheezes, or rhonchi GI: Normal bowel sounds present. Soft, Nontender, Nondistended. MSK: No edema, cyanosis, or clubbing noted Skin: Warm, dry Neuro: Grossly normal, moves all extremities Psych: Normal affect and thought content   Assessment/Plan:  Frequent stools Considering for possible GI bleed given his description of "black and sludgy" stool.  We will check FOBT to rule this out.  Also check CBC, CMET, and lipase.  If negative, consider trial of probiotic and fiber.  Dyslipidemia Continue Crestor 20 mg daily.  Obtain records from previous PCP.  GERD (gastroesophageal reflux disease) Continue ranitidine 150 mg twice daily.  Osteoarthritis Avoid NSAIDs until we can rule out GI bleed.  Continue glucosamine-chondroitin as needed.  Can use Tylenol also.  Katina Degree. [name], MD 12/14/2017 12:32 PM

## 2017-12-14 NOTE — Assessment & Plan Note (Signed)
Considering for possible GI bleed given his description of "black and sludgy" stool.  We will check FOBT to rule this out.  Also check CBC, CMET, and lipase.  If negative, consider trial of probiotic and fiber.

## 2017-12-14 NOTE — Assessment & Plan Note (Signed)
Continue ranitidine 150 mg twice daily.

## 2017-12-14 NOTE — Assessment & Plan Note (Signed)
Avoid NSAIDs until we can rule out GI bleed.  Continue glucosamine-chondroitin as needed.  Can use Tylenol also.

## 2017-12-14 NOTE — Patient Instructions (Signed)
It was very nice to see you today!  It is possible that your loose stools are from bacterial overgrowth.  I would like to make sure there is nothing else going on.  We will check blood work and a stool sample.  We will contact you with results once they are available.  You can use Imodium as needed.   Take care, Dr Jimmey RalphParker

## 2017-12-14 NOTE — Assessment & Plan Note (Signed)
Continue Crestor 20 mg daily.  Obtain records from previous PCP.

## 2017-12-15 NOTE — Progress Notes (Signed)
Please inform patient of the following:  His thyroid level is very off. This could explain some of his symptoms but need to run a confirmation test. Please have him come back in the next few days. Please place future order for free t4, free t3, TSH, and TPO antibodies.  We are still waiting on his stool test result.  Katina Degreealeb M. Jimmey RalphParker, MD 12/15/2017 5:25 PM

## 2017-12-16 ENCOUNTER — Other Ambulatory Visit: Payer: Medicare Other

## 2017-12-16 ENCOUNTER — Other Ambulatory Visit: Payer: Self-pay | Admitting: Family Medicine

## 2017-12-16 ENCOUNTER — Other Ambulatory Visit (INDEPENDENT_AMBULATORY_CARE_PROVIDER_SITE_OTHER): Payer: Medicare Other

## 2017-12-16 DIAGNOSIS — R7989 Other specified abnormal findings of blood chemistry: Secondary | ICD-10-CM

## 2017-12-16 DIAGNOSIS — K529 Noninfective gastroenteritis and colitis, unspecified: Secondary | ICD-10-CM

## 2017-12-16 LAB — T4, FREE: FREE T4: 0.37 ng/dL — AB (ref 0.60–1.60)

## 2017-12-16 LAB — T3, FREE: T3 FREE: 2.8 pg/mL (ref 2.3–4.2)

## 2017-12-16 LAB — TSH: TSH: 60.37 u[IU]/mL — AB (ref 0.35–4.50)

## 2017-12-17 LAB — THYROID PEROXIDASE ANTIBODY: Thyroperoxidase Ab SerPl-aCnc: 404 IU/mL — ABNORMAL HIGH (ref <9)

## 2017-12-17 NOTE — Progress Notes (Signed)
Please inform patient of the following:  His blood work confirmed that this thyroid level is very low. Would like for him to come in for an office visit to discuss results and treatment plan.  Katina Degreealeb M. Jimmey RalphParker, MD 12/17/2017 8:04 PM

## 2017-12-18 ENCOUNTER — Other Ambulatory Visit (INDEPENDENT_AMBULATORY_CARE_PROVIDER_SITE_OTHER): Payer: Medicare Other

## 2017-12-18 DIAGNOSIS — K529 Noninfective gastroenteritis and colitis, unspecified: Secondary | ICD-10-CM | POA: Diagnosis not present

## 2017-12-18 LAB — FECAL OCCULT BLOOD, IMMUNOCHEMICAL: Fecal Occult Bld: POSITIVE — AB

## 2017-12-21 ENCOUNTER — Ambulatory Visit: Payer: Medicare Other | Admitting: Family Medicine

## 2017-12-21 ENCOUNTER — Encounter: Payer: Self-pay | Admitting: Family Medicine

## 2017-12-21 DIAGNOSIS — K529 Noninfective gastroenteritis and colitis, unspecified: Secondary | ICD-10-CM

## 2017-12-21 DIAGNOSIS — R195 Other fecal abnormalities: Secondary | ICD-10-CM | POA: Insufficient documentation

## 2017-12-21 DIAGNOSIS — E038 Other specified hypothyroidism: Secondary | ICD-10-CM

## 2017-12-21 DIAGNOSIS — E063 Autoimmune thyroiditis: Secondary | ICD-10-CM | POA: Diagnosis not present

## 2017-12-21 MED ORDER — LEVOTHYROXINE SODIUM 50 MCG PO TABS: 50.0000 ug | ORAL_TABLET | Freq: Every day | ORAL | 3 refills | Status: DC

## 2017-12-21 MED ORDER — LEVOTHYROXINE SODIUM 25 MCG PO TABS: 25.0000 ug | ORAL_TABLET | Freq: Every day | ORAL | 0 refills | Status: DC

## 2017-12-21 NOTE — Assessment & Plan Note (Signed)
Improving.  Unclear if this is related to his hypothyroidism or not.  Continue with watchful waiting.

## 2017-12-21 NOTE — Assessment & Plan Note (Signed)
Discussed referral to GI today however patient declined.  He has a history of hemorrhoids and thinks that this is the cause.  He would like to repeat in 4 to 6 months.

## 2017-12-21 NOTE — Patient Instructions (Addendum)
It was very nice to see you today!  You have low thyroid hormone.  This is caused by an autoimmune condition.  This is treated by taking thyroid hormone by mouth.  Please start the levothyroxine 25 mcg daily.  This should be taken on an empty stomach 60 minutes before meals.  Please come back to have your thyroid level rechecked in 4 to 6 weeks.  We should recheck your stool sample for blood in 3 to 6 months.  Take care, Dr Jimmey RalphParker

## 2017-12-21 NOTE — Progress Notes (Signed)
   Subjective:  Gary Willis is a 77 y.o. male who presents today with a chief complaint of hypothyroidism.   HPI:  Hypothyroidism, new problem Patient was seen about a week ago for frequent stools.  At that visit he was found to have elevated TSH.  This was confirmed on repeat testing with elevated TSH, low T4, and elevated TPO antibody.  He additionally reports occasional chills, cold intolerance, and fatigue today.  He has never been diagnosed with thyroid issues in the past.  Frequent Stools  Seeing a week ago for this.  Symptoms have improved.  No clear alleviating factor.  Now having 1-2 soft bowel movements per day.  No abdominal pain.  Blood in Stool Found on recent FOBT.  Patient has a history of hemorrhoids.  Reports most recent colonoscopy was normal about a year ago.  ROS: Per HPI  PMH: He reports that he has never smoked. He has quit using smokeless tobacco.  His smokeless tobacco use included chew. He reports that he drinks about 1.0 standard drinks of alcohol per week. He reports that he does not use drugs.  Objective:  Physical Exam: BP 134/62 (BP Location: Left Arm, Patient Position: Sitting, Cuff Size: Normal)   Pulse 65   Temp 97.7 F (36.5 C) (Oral)   Ht 5\' 7"  (1.702 m)   Wt 186 lb 4 oz (84.5 kg)   SpO2 99%   BMI 29.17 kg/m   Gen: NAD, resting comfortably Neuro: Grossly normal, moves all extremities Psych: Normal affect and thought content  Assessment/Plan:  Hypothyroidism due to Hashimoto's thyroiditis Start low-dose Synthroid today given his advanced age.  Start 25 mcg daily.  Follow-up in 4 to 6 weeks to repeat TSH.  Frequent stools Improving.  Unclear if this is related to his hypothyroidism or not.  Continue with watchful waiting.  Heme positive stool Discussed referral to GI today however patient declined.  He has a history of hemorrhoids and thinks that this is the cause.  He would like to repeat in 4 to 6 months.    Time Spent: I spent >25  minutes face-to-face with the patient, with more than half spent on counseling for treatment plan for his hyperthyroidism, heme positive stool, and frequent stools.   Katina Degreealeb M. Jimmey RalphParker, MD 12/21/2017 3:56 PM

## 2017-12-21 NOTE — Assessment & Plan Note (Signed)
Start low-dose Synthroid today given his advanced age.  Start 25 mcg daily.  Follow-up in 4 to 6 weeks to repeat TSH.

## 2018-01-19 ENCOUNTER — Encounter: Payer: Self-pay | Admitting: Physical Therapy

## 2018-01-22 ENCOUNTER — Other Ambulatory Visit (INDEPENDENT_AMBULATORY_CARE_PROVIDER_SITE_OTHER): Payer: Medicare Other

## 2018-01-22 ENCOUNTER — Other Ambulatory Visit: Payer: Self-pay | Admitting: Family Medicine

## 2018-01-22 DIAGNOSIS — E038 Other specified hypothyroidism: Secondary | ICD-10-CM

## 2018-01-22 DIAGNOSIS — E063 Autoimmune thyroiditis: Secondary | ICD-10-CM

## 2018-01-22 LAB — TSH: TSH: 30.77 u[IU]/mL — ABNORMAL HIGH (ref 0.35–4.50)

## 2018-01-22 NOTE — Progress Notes (Signed)
Please inform patient of the following:  His thyroid level is better but still not where we need. Would like for him to increase to daily. We should recheck in 3-4 weeks. Please send in new rx.  Katina Degree. [name], MD 01/22/2018 1:10 PM

## 2018-01-25 ENCOUNTER — Other Ambulatory Visit: Payer: Medicare Other

## 2018-02-11 ENCOUNTER — Other Ambulatory Visit: Payer: Self-pay

## 2018-02-11 ENCOUNTER — Telehealth: Payer: Self-pay | Admitting: Family Medicine

## 2018-02-11 MED ORDER — LEVOTHYROXINE SODIUM 50 MCG PO TABS: 25.0000 ug | ORAL_TABLET | Freq: Every day | ORAL | 3 refills | Status: DC

## 2018-02-11 MED ORDER — ROSUVASTATIN CALCIUM 20 MG PO TABS: 20.0000 mg | ORAL_TABLET | Freq: Every day | ORAL | 3 refills | Status: DC

## 2018-02-11 MED ORDER — TAMSULOSIN HCL 0.4 MG PO CAPS: 0.4000 mg | ORAL_CAPSULE | Freq: Two times a day (BID) | ORAL | 3 refills | Status: DC

## 2018-02-11 NOTE — Telephone Encounter (Signed)
MEDICATION: tamsulosin (FLOMAX) 0.4 MG CAPS capsule  (He is now taking two of these a day- he either needs 60 or 120)  rosuvastatin (CRESTOR) 20 MG tablet (1 a day- Quantity was 60)   levothyroxine (SYNTHROID, LEVOTHROID) 25 MCG tablet (His dose is higher now, at - 1 per day)    PHARMACY:  [facility] [address] [place] - [address]  IS THIS A 90 DAY SUPPLY :  1 or 2 month supply  IS PATIENT OUT OF MEDICATION: No  IF NOT; HOW MUCH IS LEFT: Just under a week worth left  LAST APPOINTMENT DATE: @12 /9/19  NEXT APPOINTMENT DATE:@2 /10/2018  OTHER COMMENTS:    **Let patient know to contact pharmacy at the end of the day to make sure medication is ready. **  ** Please notify patient to allow 48-72 hours to process**  **Encourage patient to contact the pharmacy for refills or they can request refills through [facility]**

## 2018-02-11 NOTE — Telephone Encounter (Signed)
Rx for all sent to pharmacy. 

## 2018-02-19 IMAGING — US US CAROTID DUPLEX BILAT
1 series · 13 of 24 positions shown · non-contrast
Comparison: 09/06/2015

CLINICAL DATA: Carotid artery disease

EXAM:
BILATERAL CAROTID DUPLEX ULTRASOUND
TECHNIQUE: Gray scale imaging, color Doppler and duplex ultrasound were
performed of bilateral carotid and vertebral arteries in the neck.

[Series 1: us carotid duplex bilat · 0.06mm/px · 13 of 74 slices shown]
[im 1/74]
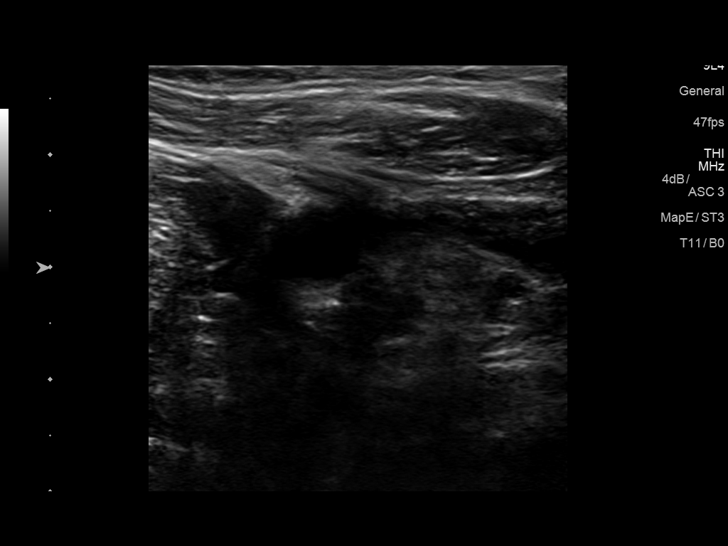
[im 7/74]
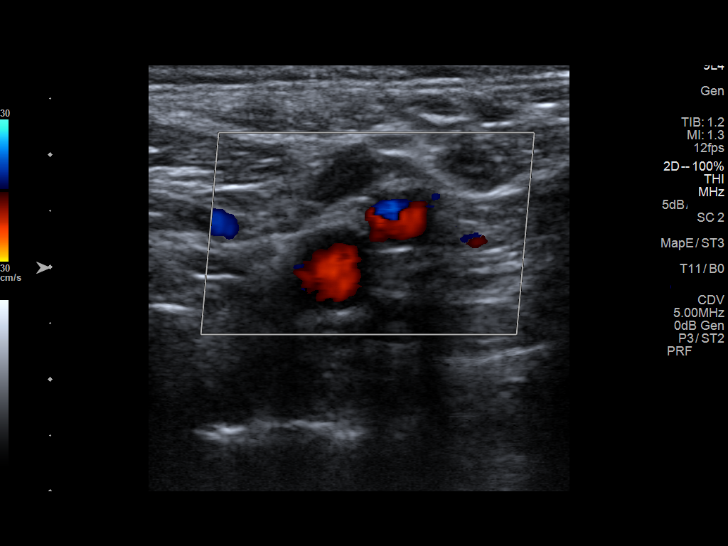
[im 13/74]
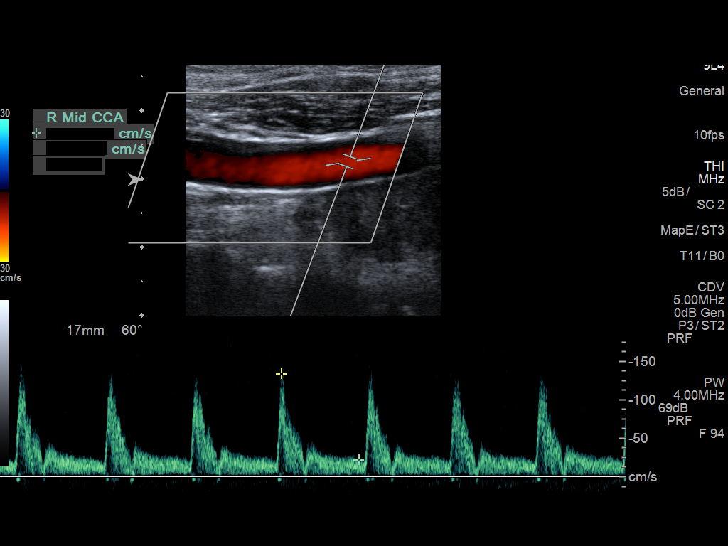
[im 20/74]
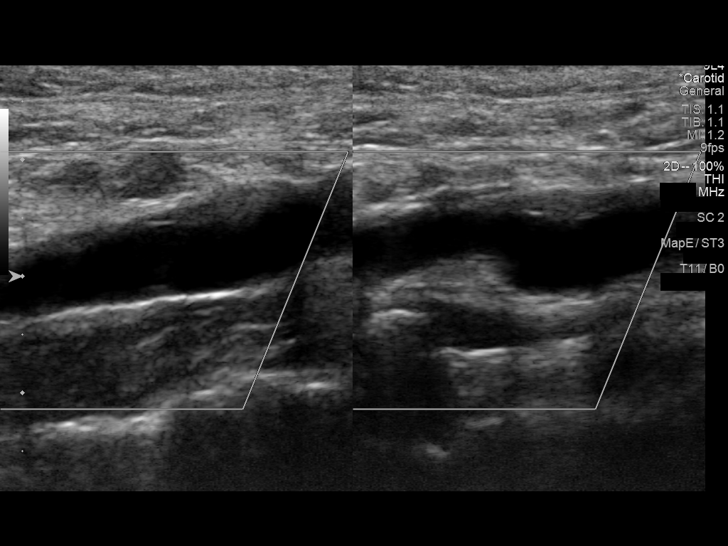
[im 26/74]
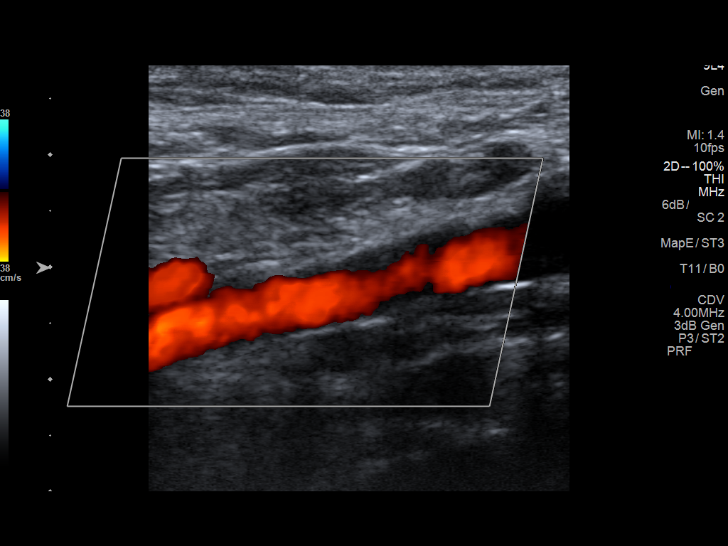
[im 32/74]
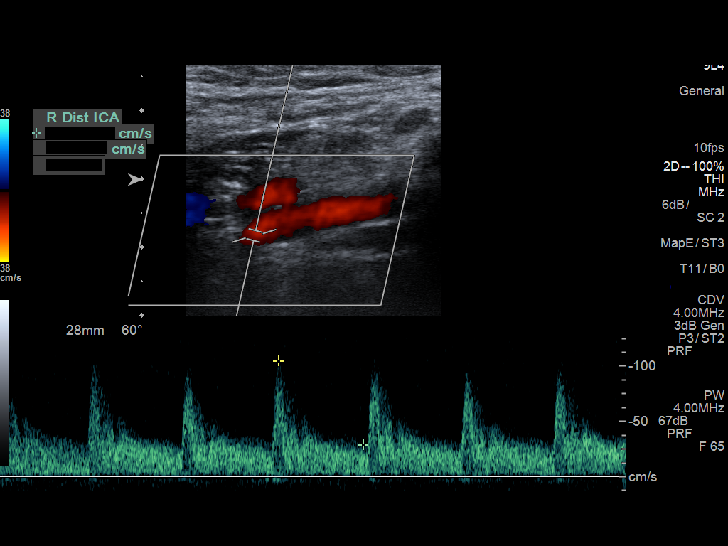
[im 39/74]
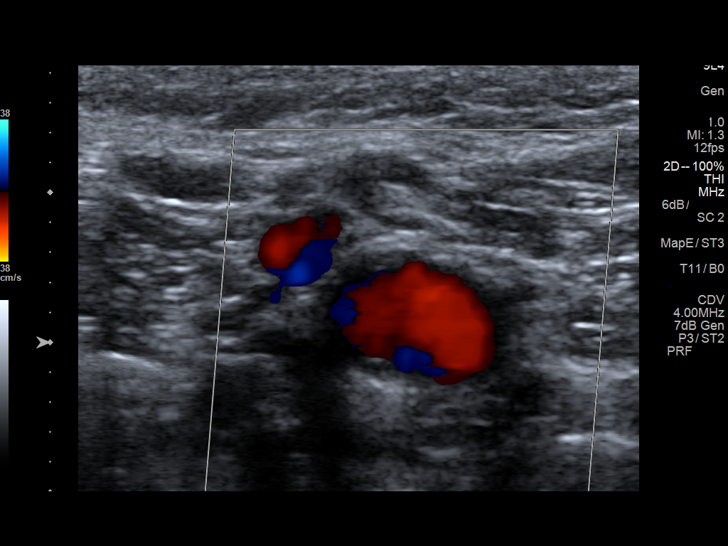
[im 42/74]
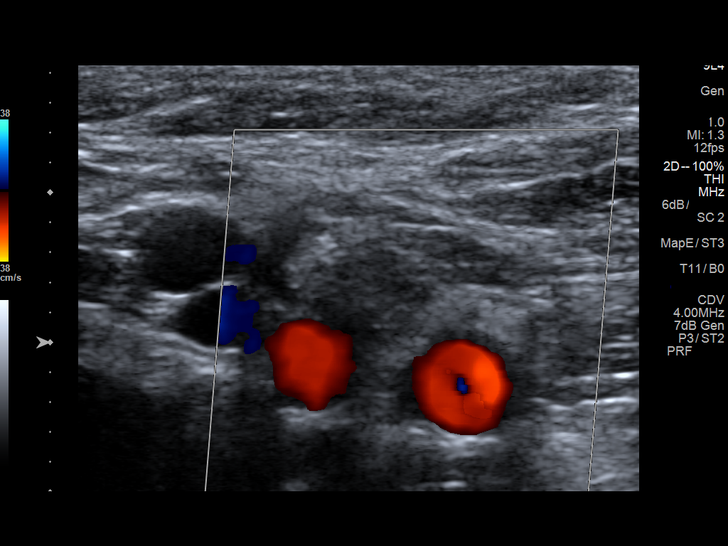
[im 48/74]
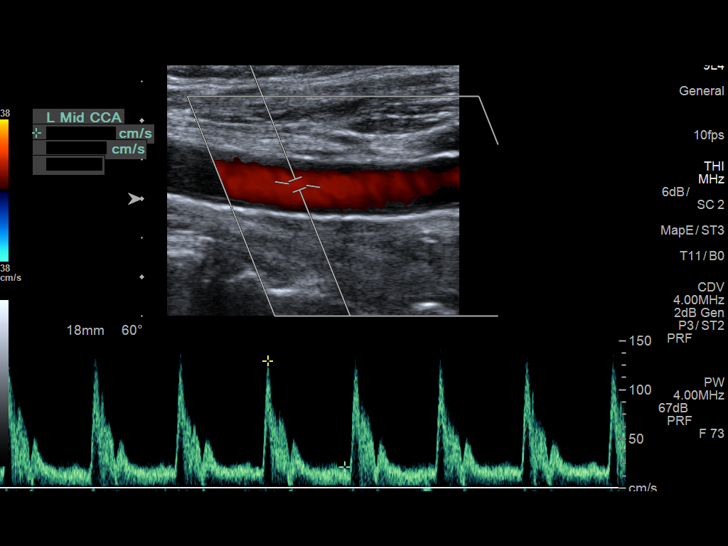
[im 54/74]
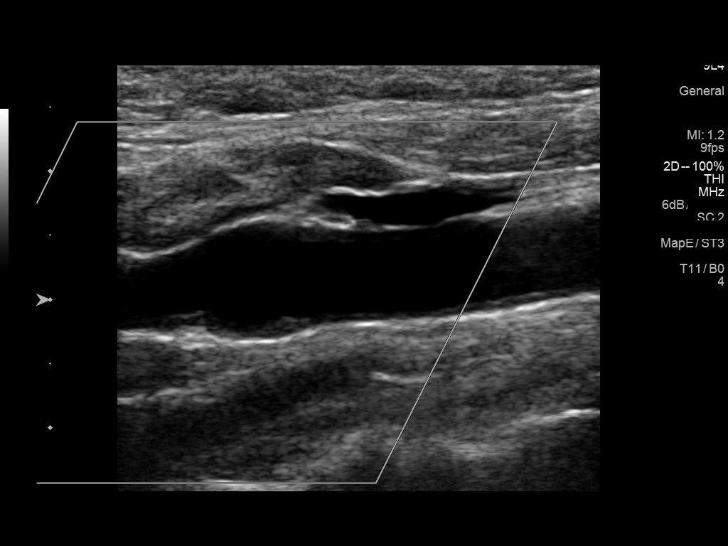
[im 61/74]
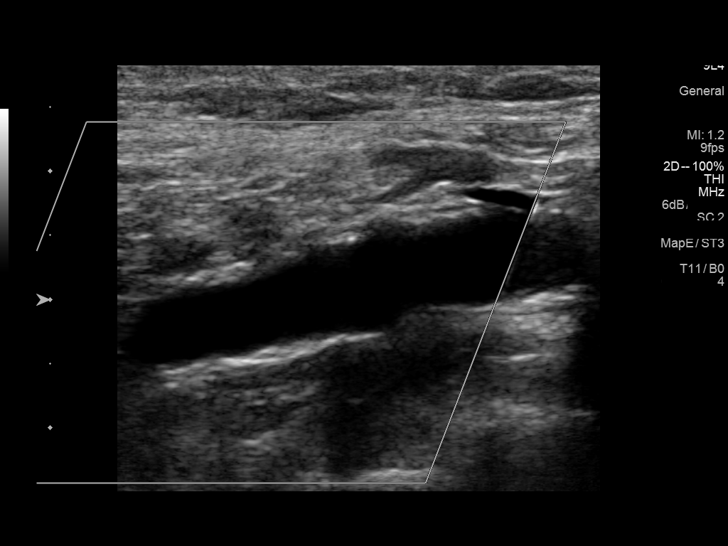
[im 67/74]
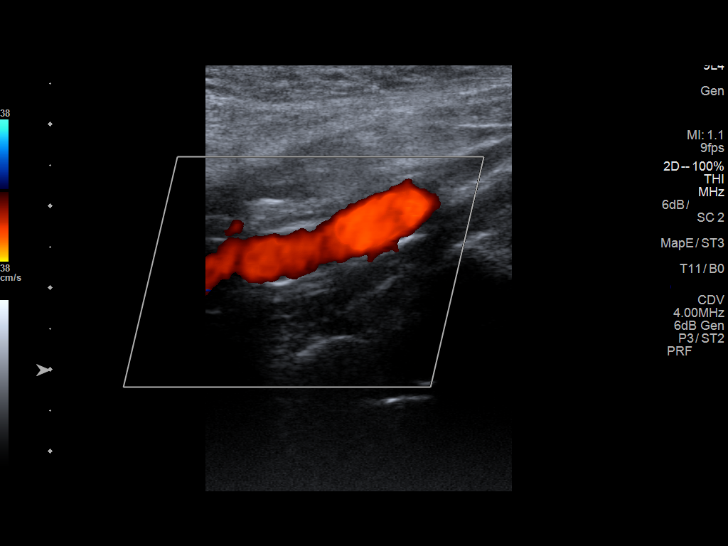
[im 74/74]
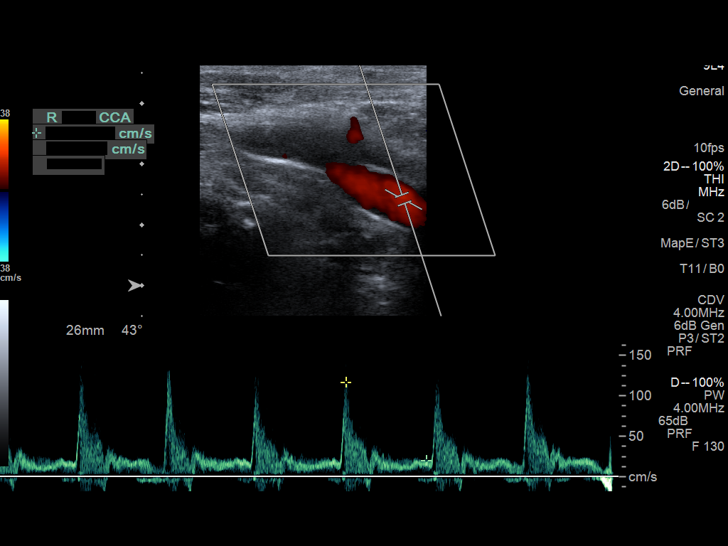

[13 of 24 positions shown; findings below may reference images not displayed]

FINDINGS: Criteria: Quantification of carotid stenosis is based on velocity
parameters that correlate the residual internal carotid diameter
with NASCET-based stenosis levels, using the diameter of the distal
internal carotid lumen as the denominator for stenosis measurement.

The following velocity measurements were obtained:

RIGHT

ICA:  129 cm/sec

CCA:  134 cm/sec

SYSTOLIC ICA/CCA RATIO:  1

DIASTOLIC ICA/CCA RATIO:

ECA:  189 cm/sec

LEFT

ICA:  138 cm/sec

CCA:  132 cm/sec

SYSTOLIC ICA/CCA RATIO:

DIASTOLIC ICA/CCA RATIO:

ECA:  163 cm/sec

RIGHT CAROTID ARTERY: There is mild smooth plaque in the bulb. Low
resistance internal carotid Doppler pattern is preserved.

RIGHT VERTEBRAL ARTERY:  Antegrade.

LEFT CAROTID ARTERY: Little if any plaque in the bulb. Low
resistance internal carotid Doppler pattern is preserved.

LEFT VERTEBRAL ARTERY:  Antegrade.
IMPRESSION: Less than 50% stenosis in the right and left internal carotid
arteries.

## 2018-02-22 ENCOUNTER — Other Ambulatory Visit (INDEPENDENT_AMBULATORY_CARE_PROVIDER_SITE_OTHER): Payer: Medicare Other

## 2018-02-22 DIAGNOSIS — E063 Autoimmune thyroiditis: Secondary | ICD-10-CM | POA: Diagnosis not present

## 2018-02-22 DIAGNOSIS — E038 Other specified hypothyroidism: Secondary | ICD-10-CM | POA: Diagnosis not present

## 2018-02-22 LAB — TSH: TSH: 16.65 u[IU]/mL — ABNORMAL HIGH (ref 0.35–4.50)

## 2018-02-22 NOTE — Progress Notes (Signed)
Please inform patient of the following:  TSH is improving, but still off just a bit. Recommend increasing synthroid to 75mcg daily. We should recheck in 4-6 weeks.  Katina Degreealeb M. Jimmey RalphParker, MD 02/22/2018 1:40 PM

## 2018-02-23 ENCOUNTER — Other Ambulatory Visit: Payer: Self-pay | Admitting: Family Medicine

## 2018-02-23 ENCOUNTER — Other Ambulatory Visit: Payer: Self-pay

## 2018-02-23 DIAGNOSIS — E063 Autoimmune thyroiditis: Principal | ICD-10-CM

## 2018-02-23 DIAGNOSIS — E038 Other specified hypothyroidism: Secondary | ICD-10-CM

## 2018-04-02 ENCOUNTER — Other Ambulatory Visit: Payer: Self-pay

## 2018-04-02 ENCOUNTER — Ambulatory Visit: Payer: Medicare Other | Admitting: Family Medicine

## 2018-04-02 ENCOUNTER — Encounter: Payer: Self-pay | Admitting: Family Medicine

## 2018-04-02 VITALS — BP 118/68 | HR 78 | Temp 97.6°F | Ht 67.0 in | Wt 184.0 lb

## 2018-04-02 DIAGNOSIS — E063 Autoimmune thyroiditis: Secondary | ICD-10-CM | POA: Diagnosis not present

## 2018-04-02 DIAGNOSIS — I739 Peripheral vascular disease, unspecified: Secondary | ICD-10-CM

## 2018-04-02 DIAGNOSIS — E785 Hyperlipidemia, unspecified: Secondary | ICD-10-CM | POA: Diagnosis not present

## 2018-04-02 DIAGNOSIS — G43909 Migraine, unspecified, not intractable, without status migrainosus: Secondary | ICD-10-CM | POA: Insufficient documentation

## 2018-04-02 DIAGNOSIS — N400 Enlarged prostate without lower urinary tract symptoms: Secondary | ICD-10-CM | POA: Insufficient documentation

## 2018-04-02 DIAGNOSIS — E038 Other specified hypothyroidism: Secondary | ICD-10-CM

## 2018-04-02 DIAGNOSIS — I779 Disorder of arteries and arterioles, unspecified: Secondary | ICD-10-CM | POA: Insufficient documentation

## 2018-04-02 DIAGNOSIS — G43809 Other migraine, not intractable, without status migrainosus: Secondary | ICD-10-CM | POA: Diagnosis not present

## 2018-04-02 LAB — TSH: TSH: 8.75 u[IU]/mL — ABNORMAL HIGH (ref 0.35–4.50)

## 2018-04-02 MED ORDER — LEVOTHYROXINE SODIUM 50 MCG PO TABS: 50.0000 ug | ORAL_TABLET | Freq: Every day | ORAL | 0 refills | Status: DC

## 2018-04-02 NOTE — Progress Notes (Signed)
   Chief Complaint:  Gary Willis is a 78 y.o. male who presents today with a chief complaint of hypothyroidism.   Assessment/Plan:  Carotid artery disease (HCC) Continue Crestor 20 mg daily.  Repeat ultrasound soon.  Migraines No red flags.  Continue Zomig as needed.  BPH (benign prostatic hyperplasia) Stable.  No red flags.  Continue Flomax 0.4 mg twice daily.  Hypothyroidism due to Hashimoto's thyroiditis Repeat TSH today.  Patient will decrease levothyroxine to 50 mcg daily.  Dyslipidemia Continue Crestor 20 mg daily.  Check lipid panel later this year with annual physical.    Subjective:  HPI:  Hypothyroidism Patient has been on levothyroxine 75 mcg for the past month.  He was previously on 50 mcg and was doing well.  Over the past 1 to 2 weeks has noticed worsening symptoms.  Has had increasing frequent stools and feelings of malaise.  He would like to go back to 50 mcg daily.  Notes that he was previously taking 50 mcg with other medications and with orange juice.  He has been taking 75 mcg on empty stomach and thinks that this could have been the cause of his worsening symptoms.  His stable, chronic medical conditions are outlined below:  # Carotid Artery Disease/ Dyslipidemia - On Crestor 20 mg daily and tolerating well - ROS: No reported myalgias, weakness, or numbness.  # Migraines -Takes Zomig as needed.  No current symptoms.  # BPH - Takes flomax 4 mg twice daily does well with this.  ROS: Per HPI  PMH: He reports that he has never smoked. He has quit using smokeless tobacco.  His smokeless tobacco use included chew. He reports current alcohol use of about 1.0 standard drinks of alcohol per week. He reports that he does not use drugs.      Objective:  Physical Exam: BP 118/68 (BP Location: Left Arm, Patient Position: Sitting, Cuff Size: Normal)   Pulse 78   Temp 97.6 F (36.4 C) (Oral)   Ht 5\' 7"  (1.702 m)   Wt 184 lb (83.5 kg)   SpO2 97%   BMI  28.82 kg/m   Gen: NAD, resting comfortably CV: Regular rate and rhythm with no murmurs appreciated Pulm: Normal work of breathing, clear to auscultation bilaterally with no crackles, wheezes, or rhonchi     Caleb M. [name], MD 04/02/2018 9:27 AM

## 2018-04-02 NOTE — Assessment & Plan Note (Signed)
Repeat TSH today.  Patient will decrease levothyroxine to 50 mcg daily.

## 2018-04-02 NOTE — Progress Notes (Signed)
Please inform patient of the following:  TSH is improving. Would like for him to take levothyroxine as we discussed on an empty stomach and we can recheck his TSH in 2-3 months.  Gary Willis. [name], MD 04/02/2018 2:19 PM

## 2018-04-02 NOTE — Assessment & Plan Note (Signed)
Continue Crestor 20 mg daily.  Repeat ultrasound soon.

## 2018-04-02 NOTE — Assessment & Plan Note (Signed)
Stable.  No red flags.  Continue Flomax 0.4 mg twice daily.

## 2018-04-02 NOTE — Assessment & Plan Note (Signed)
Continue Crestor 20 mg daily.  Check lipid panel later this year with annual physical.

## 2018-04-02 NOTE — Assessment & Plan Note (Signed)
No red flags.  Continue Zomig as needed.

## 2018-04-02 NOTE — Patient Instructions (Signed)
It was very nice to see you today!  Please decrease your levothyroxine to daily.  We will check blood work.  Come back in the fall for your annual visit, or sooner as needed.  Take care, Dr [name]

## 2018-04-14 HISTORY — PX: CATARACT EXTRACTION: SUR2

## 2018-04-26 ENCOUNTER — Other Ambulatory Visit: Payer: Self-pay | Admitting: Family Medicine

## 2018-04-26 MED ORDER — LEVOTHYROXINE SODIUM 50 MCG PO TABS: 50.0000 ug | ORAL_TABLET | Freq: Every day | ORAL | 0 refills | Status: DC

## 2018-04-26 NOTE — Telephone Encounter (Signed)
Patient is requesting 15 day supply until April 30th.   Thanks

## 2018-06-16 ENCOUNTER — Other Ambulatory Visit (INDEPENDENT_AMBULATORY_CARE_PROVIDER_SITE_OTHER): Payer: Medicare Other

## 2018-06-16 ENCOUNTER — Other Ambulatory Visit: Payer: Self-pay

## 2018-06-16 DIAGNOSIS — E063 Autoimmune thyroiditis: Secondary | ICD-10-CM | POA: Diagnosis not present

## 2018-06-16 DIAGNOSIS — E038 Other specified hypothyroidism: Secondary | ICD-10-CM

## 2018-06-16 LAB — TSH: TSH: 25.25 u[IU]/mL — ABNORMAL HIGH (ref 0.35–4.50)

## 2018-06-17 NOTE — Progress Notes (Signed)
Please inform patient of the following:  TSH increased back to 25 again. Think we should try the dose of his synthroid again. Please call in for patient. Should recheck TSH again in 4-6 weeks.  Gary Willis. [name], MD 06/17/2018 4:41 PM

## 2018-06-21 ENCOUNTER — Other Ambulatory Visit: Payer: Self-pay

## 2018-06-21 MED ORDER — LEVOTHYROXINE SODIUM 75 MCG PO TABS: 75.0000 ug | ORAL_TABLET | Freq: Every day | ORAL | 3 refills | Status: DC

## 2018-08-02 ENCOUNTER — Other Ambulatory Visit: Payer: Self-pay

## 2018-08-02 ENCOUNTER — Other Ambulatory Visit (INDEPENDENT_AMBULATORY_CARE_PROVIDER_SITE_OTHER): Payer: Medicare Other

## 2018-08-02 DIAGNOSIS — E063 Autoimmune thyroiditis: Secondary | ICD-10-CM | POA: Diagnosis not present

## 2018-08-02 DIAGNOSIS — E038 Other specified hypothyroidism: Secondary | ICD-10-CM | POA: Diagnosis not present

## 2018-08-02 LAB — TSH: TSH: 14.26 u[IU]/mL — ABNORMAL HIGH (ref 0.35–4.50)

## 2018-08-02 NOTE — Progress Notes (Signed)
Please inform patient of the following:  TSH is improved but higher than what we would like. Recommend increasing to 58mcg and we can recheck nin in at least 4-6 weeks.

## 2018-08-18 ENCOUNTER — Ambulatory Visit: Payer: Medicare Other | Admitting: Family Medicine

## 2018-08-20 ENCOUNTER — Telehealth: Payer: Self-pay | Admitting: Family Medicine

## 2018-08-20 NOTE — Telephone Encounter (Signed)
Patient was scheduled for follow up visit.  Please reschedule patient for follow up.  Thanks.

## 2018-08-20 NOTE — Telephone Encounter (Signed)
Patient is requesting new lab order.   Copied from [place] [id number]. Topic: Quick Communication - Appointment Cancellation >> Aug 20, 2018 11:46 AM Richardo Priest, NT wrote: Patient called to cancel appointment scheduled for 8/11. Patient has not rescheduled their appointment. Patient is needing new lab order placed and then he shall reschedule.   Route to department's PEC pool.

## 2018-08-20 NOTE — Telephone Encounter (Signed)
Called pt and lvm to reschedule f.,u

## 2018-08-24 ENCOUNTER — Ambulatory Visit: Payer: Medicare Other | Admitting: Family Medicine

## 2018-09-13 ENCOUNTER — Other Ambulatory Visit: Payer: Self-pay

## 2018-09-13 ENCOUNTER — Other Ambulatory Visit (INDEPENDENT_AMBULATORY_CARE_PROVIDER_SITE_OTHER): Payer: Medicare Other

## 2018-09-13 ENCOUNTER — Other Ambulatory Visit: Payer: Medicare Other

## 2018-09-13 ENCOUNTER — Telehealth: Payer: Self-pay | Admitting: Physical Therapy

## 2018-09-13 DIAGNOSIS — E038 Other specified hypothyroidism: Secondary | ICD-10-CM | POA: Diagnosis not present

## 2018-09-13 DIAGNOSIS — E063 Autoimmune thyroiditis: Secondary | ICD-10-CM | POA: Diagnosis not present

## 2018-09-13 NOTE — Telephone Encounter (Signed)
Copied from [place] [id number]. Topic: General - Inquiry >> Sep 13, 2018  4:00 PM Mathis Bud wrote: Reason for CRM: Patient called back to let office know the [facility] said they do not need to do an EKG. Call back [phone]

## 2018-09-14 ENCOUNTER — Ambulatory Visit (INDEPENDENT_AMBULATORY_CARE_PROVIDER_SITE_OTHER): Payer: Medicare Other | Admitting: Family Medicine

## 2018-09-14 ENCOUNTER — Other Ambulatory Visit: Payer: Self-pay

## 2018-09-14 ENCOUNTER — Encounter: Payer: Self-pay | Admitting: Family Medicine

## 2018-09-14 ENCOUNTER — Other Ambulatory Visit: Payer: Medicare Other

## 2018-09-14 VITALS — BP 130/68 | HR 65 | Temp 98.2°F | Ht 67.0 in | Wt 182.2 lb

## 2018-09-14 DIAGNOSIS — E038 Other specified hypothyroidism: Secondary | ICD-10-CM

## 2018-09-14 DIAGNOSIS — I779 Disorder of arteries and arterioles, unspecified: Secondary | ICD-10-CM

## 2018-09-14 DIAGNOSIS — Z23 Encounter for immunization: Secondary | ICD-10-CM

## 2018-09-14 DIAGNOSIS — Z01818 Encounter for other preprocedural examination: Secondary | ICD-10-CM | POA: Diagnosis not present

## 2018-09-14 DIAGNOSIS — E785 Hyperlipidemia, unspecified: Secondary | ICD-10-CM

## 2018-09-14 DIAGNOSIS — I739 Peripheral vascular disease, unspecified: Secondary | ICD-10-CM | POA: Diagnosis not present

## 2018-09-14 DIAGNOSIS — E063 Autoimmune thyroiditis: Secondary | ICD-10-CM

## 2018-09-14 LAB — TSH: TSH: 7.91 u[IU]/mL — ABNORMAL HIGH (ref 0.35–4.50)

## 2018-09-14 MED ORDER — LEVOTHYROXINE SODIUM 100 MCG PO TABS: 100.0000 ug | ORAL_TABLET | Freq: Every day | ORAL | 2 refills | Status: DC

## 2018-09-14 NOTE — Assessment & Plan Note (Signed)
TSH level is pending.  Continue current dose.

## 2018-09-14 NOTE — Telephone Encounter (Signed)
Noted  

## 2018-09-14 NOTE — Assessment & Plan Note (Signed)
Continue Crestor 20 mg daily.  He will return soon for CPE.  Check lipid panel at that time.

## 2018-09-14 NOTE — Progress Notes (Signed)
Chief Complaint:  Gary Willis is a 78 y.o. male who presents today for consultation for surgical clearance at the request of Dr. Noemi Chapel.  Assessment/Plan:  Encounter for Surgical Clearance EKG today normal.  Patient will be undergoing a low risk procedure.  He is very low risk.  Patient is cleared to have surgery later this week.  Carotid artery disease (HCC) Continue Crestor 20 mg daily.  Consider repeat ultrasound soon.  Discussed reasons to return to care.  Discussed warning signs.  Hypothyroidism due to Hashimoto's thyroiditis TSH level is pending.  Continue current dose.  Dyslipidemia Continue Crestor 20 mg daily.  He will return soon for CPE.  Check lipid panel at that time.  Preventative Healthcare Flu shot given today.  He will return soon for CPE.    Subjective:  HPI:  Right Shoulder Pain Patient will be undergoing right shoulder arthroscopy later this week.  This procedure will be performed by Dr. Para March. Patient has had issues with his right shoulder for the past 3 years. Injured it during an accident on a golf cart.  Had MRI a week ago that showed partial rotator cuff tear and bone spurs. Takes ibuprofen which helps. Worse when playing golf and doing other activities. No other obvious alleviating or aggravating factors.   His stable, chronic medical conditions are outlined below:  # Hypothyroidism - On synthroid 51mcg daily and doing well  # Dyslipidemia - On crestor 20mg  daily and doing well - ROS: No reported mylagias  ROS: Per HPI, otherwise a complete review of systems was negative.   PMH:  The following were reviewed and entered/updated in epic: Past Medical History:  Diagnosis Date  . Arthritis   . Hx of cardiovascular stress test    ETT-Myoview (05/2013):  diaph atten, no ischemia, EF 63%; Low Risk  . Hyperlipidemia   . Migraines    Patient Active Problem List   Diagnosis Date Noted  . Carotid artery disease ([place]) 04/02/2018  . Migraines  04/02/2018  . BPH (benign prostatic hyperplasia) 04/02/2018  . Hypothyroidism due to Hashimoto's thyroiditis 12/21/2017  . Heme positive stool 12/21/2017  . Frequent stools 12/14/2017  . Dyslipidemia 12/14/2017  . GERD (gastroesophageal reflux disease) 12/14/2017  . Osteoarthritis 12/14/2017   Past Surgical History:  Procedure Laterality Date  . TREATMENT FISTULA ANAL      Family History  Problem Relation Age of Onset  . Lung cancer Mother   . Heart attack Father   . High Cholesterol Father   . Diabetes Sister   . Colon cancer Neg Hx     Medications- reviewed and updated Current Outpatient Medications  Medication Sig Dispense Refill  . co-enzyme Q-10 30 MG capsule Take 30 mg by mouth every morning.    Marland Kitchen glucosamine-chondroitin 500-400 MG tablet Take 1 tablet by mouth 2 (two) times daily.    Marland Kitchen levothyroxine (SYNTHROID) 75 MCG tablet Take 1 tablet (75 mcg total) by mouth daily. 90 tablet 3  . Multiple Vitamins-Minerals (MULTIVITAMIN WITH MINERALS) tablet Take 1 tablet by mouth every morning.    . raNITIdine HCl (ACID CONTROL PO) Take by mouth.    . Red Yeast Rice Extract (RED YEAST RICE PO) Take 1 tablet by mouth 2 (two) times daily.    . rosuvastatin (CRESTOR) 20 MG tablet Take 1 tablet (20 mg total) by mouth daily. 90 tablet 3  . tamsulosin (FLOMAX) 0.4 MG CAPS capsule Take 1 capsule (0.4 mg total) by mouth 2 (two) times daily. 180 capsule  3   No current facility-administered medications for this visit.     Allergies-reviewed and updated No Known Allergies  Social History   Socioeconomic History  . Marital status: Divorced    Spouse name: Not on file  . Number of children: Not on file  . Years of education: Not on file  . Highest education level: Not on file  Occupational History  . Not on file  Social Needs  . Financial resource strain: Not on file  . Food insecurity    Worry: Not on file    Inability: Not on file  . Transportation needs    Medical: Not on  file    Non-medical: Not on file  Tobacco Use  . Smoking status: Never Smoker  . Smokeless tobacco: Former NeurosurgeonUser    Types: Chew  Substance and Sexual Activity  . Alcohol use: Yes    Alcohol/week: 1.0 standard drinks    Types: 1 Cans of beer per week    Comment: social  . Drug use: No  . Sexual activity: Not on file  Lifestyle  . Physical activity    Days per week: Not on file    Minutes per session: Not on file  . Stress: Not on file  Relationships  . Social Musicianconnections    Talks on phone: Not on file    Gets together: Not on file    Attends religious service: Not on file    Active member of club or organization: Not on file    Attends meetings of clubs or organizations: Not on file    Relationship status: Not on file  Other Topics Concern  . Not on file  Social History Narrative  . Not on file         Objective:  Physical Exam: BP 130/68   Pulse 65   Temp 98.2 F (36.8 C)   Ht 5\' 7"  (1.702 m)   Wt 182 lb 3.2 oz (82.6 kg)   SpO2 95%   BMI 28.54 kg/m   Gen: NAD, resting comfortably CV: Regular rate and rhythm with no murmurs appreciated Pulm: Normal work of breathing, clear to auscultation bilaterally with no crackles, wheezes, or rhonchi GI: Normal bowel sounds present. Soft, Nontender, Nondistended. MSK: No edema, cyanosis, or clubbing noted Skin: Warm, dry Neuro: Grossly normal, moves all extremities Psych: Normal affect and thought content  EKG: NSR. No q waves or other ischemic changes.       A copy of this note will be forwarded to the requesting physician.   Katina Degreealeb M. Jimmey RalphParker, MD 09/14/2018 11:25 AM

## 2018-09-14 NOTE — Patient Instructions (Signed)
It was very nice to see you today!  Your EKG today is normal!  We will call you with your blood work once it is back.  Come back to see me in a few months for your physical, or sooner if needed.  Take care, Dr Jerline Pain  Please try these tips to maintain a healthy lifestyle:   Eat at least 3 REAL meals and 1-2 snacks per day.  Aim for no more than 5 hours between eating.  If you eat breakfast, please do so within one hour of getting up.    Obtain twice as many fruits/vegetables as protein or carbohydrate foods for both lunch and dinner. (Half of each meal should be fruits/vegetables, one quarter protein, and one quarter starchy carbs)   Cut down on sweet beverages. This includes juice, soda, and sweet tea.    Exercise at least 150 minutes every week.

## 2018-09-14 NOTE — Progress Notes (Signed)
Please inform patient of the following:  TSH better but still just a bit above where we would like for it to be. Please send in 126mcg tablet. Would like for him to recheck in 4-6 weeks.  Algis Greenhouse. Jerline Pain, MD 09/14/2018 1:06 PM

## 2018-09-14 NOTE — Assessment & Plan Note (Signed)
Continue Crestor 20 mg daily.  Consider repeat ultrasound soon.  Discussed reasons to return to care.  Discussed warning signs.

## 2018-11-01 ENCOUNTER — Other Ambulatory Visit: Payer: Self-pay

## 2018-11-01 ENCOUNTER — Telehealth: Payer: Self-pay | Admitting: Family Medicine

## 2018-11-01 DIAGNOSIS — Z0000 Encounter for general adult medical examination without abnormal findings: Secondary | ICD-10-CM

## 2018-11-01 NOTE — Telephone Encounter (Signed)
Please advise 

## 2018-11-01 NOTE — Telephone Encounter (Signed)
See note  Copied from [place] [id number]. Topic: General - Other >> Nov 01, 2018 10:32 AM Rainey Pines A wrote: Patient is requesting his lab order be placed for cpe labs on 11-12-2018

## 2018-11-01 NOTE — Telephone Encounter (Signed)
Orders have been placed.

## 2018-11-01 NOTE — Telephone Encounter (Signed)
Please place future orders for TSH, CBC. CMET, lipid panel, and A1c.  Gary Willis. Jerline Pain, MD 11/01/2018 3:59 PM

## 2018-11-08 ENCOUNTER — Other Ambulatory Visit: Payer: Medicare Other

## 2018-11-12 ENCOUNTER — Other Ambulatory Visit (INDEPENDENT_AMBULATORY_CARE_PROVIDER_SITE_OTHER): Payer: Medicare Other

## 2018-11-12 ENCOUNTER — Other Ambulatory Visit: Payer: Self-pay

## 2018-11-12 DIAGNOSIS — Z0000 Encounter for general adult medical examination without abnormal findings: Secondary | ICD-10-CM | POA: Diagnosis not present

## 2018-11-12 LAB — CBC
HCT: 43.3 % (ref 39.0–52.0)
Hemoglobin: 14.7 g/dL (ref 13.0–17.0)
MCHC: 33.9 g/dL (ref 30.0–36.0)
MCV: 85.7 fl (ref 78.0–100.0)
Platelets: 283 10*3/uL (ref 150.0–400.0)
RBC: 5.05 Mil/uL (ref 4.22–5.81)
RDW: 13.1 % (ref 11.5–15.5)
WBC: 8.7 10*3/uL (ref 4.0–10.5)

## 2018-11-12 LAB — COMPREHENSIVE METABOLIC PANEL
ALT: 16 U/L (ref 0–53)
AST: 21 U/L (ref 0–37)
Albumin: 4.2 g/dL (ref 3.5–5.2)
Alkaline Phosphatase: 60 U/L (ref 39–117)
BUN: 13 mg/dL (ref 6–23)
CO2: 26 mEq/L (ref 19–32)
Calcium: 9 mg/dL (ref 8.4–10.5)
Chloride: 105 mEq/L (ref 96–112)
Creatinine, Ser: 0.86 mg/dL (ref 0.40–1.50)
GFR: 86.25 mL/min (ref >60.00)
Glucose, Bld: 107 mg/dL — ABNORMAL HIGH (ref 70–99)
Potassium: 4.2 mEq/L (ref 3.5–5.1)
Sodium: 139 mEq/L (ref 135–145)
Total Bilirubin: 0.6 mg/dL (ref 0.2–1.2)
Total Protein: 7.3 g/dL (ref 6.0–8.3)

## 2018-11-12 LAB — LIPID PANEL
Cholesterol: 123 mg/dL (ref 0–200)
HDL: 43.7 mg/dL (ref >39.00)
LDL Cholesterol: 59 mg/dL (ref 0–99)
NonHDL: 79.2
Total CHOL/HDL Ratio: 3
Triglycerides: 99 mg/dL (ref 0.0–149.0)
VLDL: 19.8 mg/dL (ref 0.0–40.0)

## 2018-11-12 LAB — TSH: TSH: 4.14 u[IU]/mL (ref 0.35–4.50)

## 2018-11-12 LAB — HEMOGLOBIN A1C: Hgb A1c MFr Bld: 5.9 % (ref 4.6–6.5)

## 2018-11-15 NOTE — Progress Notes (Signed)
Please inform patient of the following:  His blood sugar level is borderline but stable. The rest of his blood work including his thyroid is NORMAL. Recommend continuin g current medications and for him to work on diet and exercise to reduce blood sugar. We can recheck in a year or so.  Gary Willis. Jerline Pain, MD 11/15/2018 10:52 AM

## 2018-11-23 ENCOUNTER — Ambulatory Visit (INDEPENDENT_AMBULATORY_CARE_PROVIDER_SITE_OTHER): Payer: Medicare Other | Admitting: Family Medicine

## 2018-11-23 ENCOUNTER — Encounter: Payer: Self-pay | Admitting: Family Medicine

## 2018-11-23 ENCOUNTER — Ambulatory Visit (INDEPENDENT_AMBULATORY_CARE_PROVIDER_SITE_OTHER): Payer: Medicare Other

## 2018-11-23 ENCOUNTER — Other Ambulatory Visit: Payer: Self-pay

## 2018-11-23 VITALS — BP 124/66 | HR 70 | Temp 97.2°F | Ht 67.0 in | Wt 180.0 lb

## 2018-11-23 VITALS — BP 124/66 | Temp 97.2°F | Ht 67.0 in | Wt 179.9 lb

## 2018-11-23 DIAGNOSIS — N400 Enlarged prostate without lower urinary tract symptoms: Secondary | ICD-10-CM

## 2018-11-23 DIAGNOSIS — E038 Other specified hypothyroidism: Secondary | ICD-10-CM

## 2018-11-23 DIAGNOSIS — Z0000 Encounter for general adult medical examination without abnormal findings: Secondary | ICD-10-CM | POA: Diagnosis not present

## 2018-11-23 DIAGNOSIS — K219 Gastro-esophageal reflux disease without esophagitis: Secondary | ICD-10-CM

## 2018-11-23 DIAGNOSIS — Z0001 Encounter for general adult medical examination with abnormal findings: Secondary | ICD-10-CM

## 2018-11-23 DIAGNOSIS — E063 Autoimmune thyroiditis: Secondary | ICD-10-CM

## 2018-11-23 DIAGNOSIS — R739 Hyperglycemia, unspecified: Secondary | ICD-10-CM | POA: Insufficient documentation

## 2018-11-23 DIAGNOSIS — E785 Hyperlipidemia, unspecified: Secondary | ICD-10-CM | POA: Diagnosis not present

## 2018-11-23 DIAGNOSIS — M1990 Unspecified osteoarthritis, unspecified site: Secondary | ICD-10-CM

## 2018-11-23 NOTE — Assessment & Plan Note (Signed)
Last TSH at goal.  Continue Synthroid 100 mcg daily.  Recheck TSH in 1 year.

## 2018-11-23 NOTE — Progress Notes (Signed)
Subjective:   Gary Willis is a 78 y.o. male who presents for Medicare Annual/Subsequent preventive examination.  Review of Systems:   Cardiac Risk Factors include: advanced age (>64men, >28 women);male gender;dyslipidemia    Objective:    Vitals: BP 124/66   Temp (!) 97.2 F (36.2 C)   Ht 5\' 7"  (1.702 m)   Wt 179 lb 14.3 oz (81.6 kg)   BMI 28.18 kg/m   Body mass index is 28.18 kg/m.  Advanced Directives 11/23/2018  Does Patient Have a Medical Advance Directive? No  Would patient like information on creating a medical advance directive? Yes (MAU/Ambulatory/Procedural Areas - Information given)    Tobacco Social History   Tobacco Use  Smoking Status Never Smoker  Smokeless Tobacco Former Systems developer  . Types: Chew     Counseling given: Not Answered   Clinical Intake:  Pre-visit preparation completed: Yes  Pain : No/denies pain  Diabetes: No  How often do you need to have someone help you when you read instructions, pamphlets, or other written materials from your doctor or pharmacy?: 1 - Never  Interpreter Needed?: No  Information entered by :: Denman George LPN  Past Medical History:  Diagnosis Date  . Arthritis   . Hx of cardiovascular stress test    ETT-Myoview (05/2013):  diaph atten, no ischemia, EF 63%; Low Risk  . Hyperlipidemia   . Migraines    Past Surgical History:  Procedure Laterality Date  . CATARACT EXTRACTION  04/2018  . EYE SURGERY    . TREATMENT FISTULA ANAL     Family History  Problem Relation Age of Onset  . Lung cancer Mother   . Heart attack Father   . High Cholesterol Father   . Diabetes Sister   . Colon cancer Neg Hx    Social History   Socioeconomic History  . Marital status: Divorced    Spouse name: Not on file  . Number of children: Not on file  . Years of education: Not on file  . Highest education level: Not on file  Occupational History  . Not on file  Social Needs  . Financial resource strain: Not on file   . Food insecurity    Worry: Not on file    Inability: Not on file  . Transportation needs    Medical: Not on file    Non-medical: Not on file  Tobacco Use  . Smoking status: Never Smoker  . Smokeless tobacco: Former Systems developer    Types: Chew  Substance and Sexual Activity  . Alcohol use: Yes    Alcohol/week: 1.0 standard drinks    Types: 1 Cans of beer per week    Comment: social  . Drug use: No  . Sexual activity: Not on file  Lifestyle  . Physical activity    Days per week: Not on file    Minutes per session: Not on file  . Stress: Not on file  Relationships  . Social Herbalist on phone: Not on file    Gets together: Not on file    Attends religious service: Not on file    Active member of club or organization: Not on file    Attends meetings of clubs or organizations: Not on file    Relationship status: Not on file  Other Topics Concern  . Not on file  Social History Narrative  . Not on file    Outpatient Encounter Medications as of 11/23/2018  Medication Sig  .  co-enzyme Q-10 30 MG capsule Take 30 mg by mouth every morning.  Marland Kitchen glucosamine-chondroitin 500-400 MG tablet Take 1 tablet by mouth 2 (two) times daily.  Marland Kitchen levothyroxine (SYNTHROID) 100 MCG tablet Take 1 tablet (100 mcg total) by mouth daily.  . Multiple Vitamins-Minerals (MULTIVITAMIN WITH MINERALS) tablet Take 1 tablet by mouth every morning.  . raNITIdine HCl (ACID CONTROL PO) Take by mouth.  . Red Yeast Rice Extract (RED YEAST RICE PO) Take 1 tablet by mouth 2 (two) times daily.  . rosuvastatin (CRESTOR) 20 MG tablet Take 1 tablet (20 mg total) by mouth daily.  . tamsulosin (FLOMAX) 0.4 MG CAPS capsule Take 1 capsule (0.4 mg total) by mouth 2 (two) times daily.   No facility-administered encounter medications on file as of 11/23/2018.     Activities of Daily Living In your present state of health, do you have any difficulty performing the following activities: 11/23/2018  Hearing? N  Vision? N   Difficulty concentrating or making decisions? N  Walking or climbing stairs? N  Dressing or bathing? N  Doing errands, shopping? N  Preparing Food and eating ? N  Using the Toilet? N  In the past six months, have you accidently leaked urine? N  Do you have problems with loss of bowel control? N  Managing your Medications? N  Managing your Finances? N  Housekeeping or managing your Housekeeping? N  Some recent data might be hidden    Patient Care Team: Ardith Dark, MD as PCP - General (Family Medicine) Maris Berger, MD as Consulting Physician (Ophthalmology)   Assessment:   This is a routine wellness examination for Gary Willis.  Exercise Activities and Dietary recommendations Current Exercise Habits: Home exercise routine, Type of exercise: walking, Time (Minutes): 30, Frequency (Times/Week): 5, Weekly Exercise (Minutes/Week): 150, Intensity: Mild  Goals   None     Fall Risk Fall Risk  11/23/2018 11/23/2018  Falls in the past year? 0 0  Injury with Fall? 0 -  Follow up Falls evaluation completed;Education provided;Falls prevention discussed -   Is the patient's home free of loose throw rugs in walkways, pet beds, electrical cords, etc?   yes      Grab bars in the bathroom? yes      Handrails on the stairs?   yes      Adequate lighting?   yes  Timed Get Up and Go Performed: completed and within normal timeframe; no gait abnormalities noted  Depression Screen PHQ 2/9 Scores 11/23/2018 11/23/2018 12/14/2017  PHQ - 2 Score 0 0 0    Cognitive Function-no cognitive concerns at this time    6CIT Screen 11/23/2018  What Year? 0 points  What month? 0 points  What time? 0 points  Count back from 20 0 points  Months in reverse 0 points  Repeat phrase 0 points  Total Score 0    Immunization History  Administered Date(s) Administered  . Fluad Quad(high Dose 65+) 09/14/2018  . Influenza-Unspecified 10/13/2011, 11/08/2013, 12/06/2014, 10/02/2015, 11/27/2016,  09/24/2017, 10/14/2017  . Pneumococcal Conjugate-13 06/15/2013  . Pneumococcal Polysaccharide-23 10/06/2008  . Td 10/13/1997, 10/06/2008  . Zoster 09/09/2010    Qualifies for Shingles Vaccine? Discussed and patient will check with pharmacy for coverage.  Patient education handout provided   Screening Tests Health Maintenance  Topic Date Due  . TETANUS/TDAP  11/23/2019 (Originally 10/07/2018)  . INFLUENZA VACCINE  Completed  . PNA vac Low Risk Adult  Completed   Cancer Screenings: Lung: Low Dose CT Chest recommended  if Age 69-80 years, 30 pack-year currently smoking OR have quit w/in 15years. Patient does not qualify. Colorectal: colonoscopy 06/11/16 with [facility]      Plan:  I have personally reviewed and addressed the Medicare Annual Wellness questionnaire and have noted the following in the patient's chart:  A. Medical and social history B. Use of alcohol, tobacco or illicit drugs  C. Current medications and supplements D. Functional ability and status E.  Nutritional status F.  Physical activity G. Advance directives H. List of other physicians I.  Hospitalizations, surgeries, and ER visits in previous 12 months J.  Vitals K. Screenings such as hearing and vision if needed, cognitive and depression L. Referrals, records requested, and appointments- none   In addition, I have reviewed and discussed with patient certain preventive protocols, quality metrics, and best practice recommendations. A written personalized care plan for preventive services as well as general preventive health recommendations were provided to patient.   Signed,  Kandis Fantasiaourtney Slade, LPN  Nurse Health Advisor   Nurse Notes: no additional

## 2018-11-23 NOTE — Patient Instructions (Signed)
It was very nice to see you today!  Please use the voltaren as needed for your hands.  Keep up the good work!  Come back in 1 year for your next physical, or sooner if needed.   Take care, Dr Jerline Pain  Please try these tips to maintain a healthy lifestyle:   Eat at least 3 REAL meals and 1-2 snacks per day.  Aim for no more than 5 hours between eating.  If you eat breakfast, please do so within one hour of getting up.    Obtain twice as many fruits/vegetables as protein or carbohydrate foods for both lunch and dinner. (Half of each meal should be fruits/vegetables, one quarter protein, and one quarter starchy carbs)   Cut down on sweet beverages. This includes juice, soda, and sweet tea.    Exercise at least 150 minutes every week.    Preventive Care 37 Years and Older, Male Preventive care refers to lifestyle choices and visits with your health care provider that can promote health and wellness. This includes:  A yearly physical exam. This is also called an annual well check.  Regular dental and eye exams.  Immunizations.  Screening for certain conditions.  Healthy lifestyle choices, such as diet and exercise. What can I expect for my preventive care visit? Physical exam Your health care provider will check:  Height and weight. These may be used to calculate body mass index (BMI), which is a measurement that tells if you are at a healthy weight.  Heart rate and blood pressure.  Your skin for abnormal spots. Counseling Your health care provider may ask you questions about:  Alcohol, tobacco, and drug use.  Emotional well-being.  Home and relationship well-being.  Sexual activity.  Eating habits.  History of falls.  Memory and ability to understand (cognition).  Work and work Statistician. What immunizations do I need?  Influenza (flu) vaccine  This is recommended every year. Tetanus, diphtheria, and pertussis (Tdap) vaccine  You may need a Td  booster every 10 years. Varicella (chickenpox) vaccine  You may need this vaccine if you have not already been vaccinated. Zoster (shingles) vaccine  You may need this after age 72. Pneumococcal conjugate (PCV13) vaccine  One dose is recommended after age 45. Pneumococcal polysaccharide (PPSV23) vaccine  One dose is recommended after age 77. Measles, mumps, and rubella (MMR) vaccine  You may need at least one dose of MMR if you were born in 1957 or later. You may also need a second dose. Meningococcal conjugate (MenACWY) vaccine  You may need this if you have certain conditions. Hepatitis A vaccine  You may need this if you have certain conditions or if you travel or work in places where you may be exposed to hepatitis A. Hepatitis B vaccine  You may need this if you have certain conditions or if you travel or work in places where you may be exposed to hepatitis B. Haemophilus influenzae type b (Hib) vaccine  You may need this if you have certain conditions. You may receive vaccines as individual doses or as more than one vaccine together in one shot (combination vaccines). Talk with your health care provider about the risks and benefits of combination vaccines. What tests do I need? Blood tests  Lipid and cholesterol levels. These may be checked every 5 years, or more frequently depending on your overall health.  Hepatitis C test.  Hepatitis B test. Screening  Lung cancer screening. You may have this screening every year starting at  age 18 if you have a 30-pack-year history of smoking and currently smoke or have quit within the past 15 years.  Colorectal cancer screening. All adults should have this screening starting at age 76 and continuing until age 48. Your health care provider may recommend screening at age 84 if you are at increased risk. You will have tests every 1-10 years, depending on your results and the type of screening test.  Prostate cancer screening.  Recommendations will vary depending on your family history and other risks.  Diabetes screening. This is done by checking your blood sugar (glucose) after you have not eaten for a while (fasting). You may have this done every 1-3 years.  Abdominal aortic aneurysm (AAA) screening. You may need this if you are a current or former smoker.  Sexually transmitted disease (STD) testing. Follow these instructions at home: Eating and drinking  Eat a diet that includes fresh fruits and vegetables, whole grains, lean protein, and low-fat dairy products. Limit your intake of foods with high amounts of sugar, saturated fats, and salt.  Take vitamin and mineral supplements as recommended by your health care provider.  Do not drink alcohol if your health care provider tells you not to drink.  If you drink alcohol: ? Limit how much you have to 0-2 drinks a day. ? Be aware of how much alcohol is in your drink. In the [place] one drink equals one 12 oz bottle of beer (355 mL), one 5 oz glass of wine (148 mL), or one 1 oz glass of hard liquor (44 mL). Lifestyle  Take daily care of your teeth and gums.  Stay active. Exercise for at least 30 minutes on 5 or more days each week.  Do not use any products that contain nicotine or tobacco, such as cigarettes, e-cigarettes, and chewing tobacco. If you need help quitting, ask your health care provider.  If you are sexually active, practice safe sex. Use a condom or other form of protection to prevent STIs (sexually transmitted infections).  Talk with your health care provider about taking a low-dose aspirin or statin. What's next?  Visit your health care provider once a year for a well check visit.  Ask your health care provider how often you should have your eyes and teeth checked.  Stay up to date on all vaccines. This information is not intended to replace advice given to you by your health care provider. Make sure you discuss any questions you have with  your health care provider. Document Released: 01/26/2015 Document Revised: 12/24/2017 Document Reviewed: 12/24/2017 Elsevier Patient Education  2020 [employer].

## 2018-11-23 NOTE — Progress Notes (Signed)
I have personally reviewed the Medicare Annual Wellness Visit and agree with the assessment and plan.  Algis Greenhouse. Jerline Pain, MD 11/23/2018 1:55 PM

## 2018-11-23 NOTE — Assessment & Plan Note (Signed)
Recommended over-the-counter Voltaren gel for hands and knees.

## 2018-11-23 NOTE — Progress Notes (Signed)
Chief Complaint:  Gary Willis is a 78 y.o. male who presents today for his annual comprehensive physical exam.    Assessment/Plan:  BPH (benign prostatic hyperplasia) Stable. Continue flomax 0.4mg  twice daily.   Hypothyroidism due to Hashimoto's thyroiditis Last TSH at goal.  Continue Synthroid 100 mcg daily.  Recheck TSH in 1 year.  Osteoarthritis Recommended over-the-counter Voltaren gel for hands and knees.  GERD (gastroesophageal reflux disease) Continue over-the-counter heartburn meds as needed.  Dyslipidemia Last lipid panel at goal.  Continue Crestor 20 mg daily.  Hyperglycemia Fasting glucose 107.  A1c 5.9.  Discussed lifestyle modifications.  Recheck in 1 year.  Body mass index is 28.19 kg/m. / Overweight BMI Metric Follow Up - 11/23/18 0848      BMI Metric Follow Up-Please document annually   BMI Metric Follow Up  Education provided      Preventative Healthcare: Up-to-date on flu vaccine.  Up-to-date on colonoscopy-does not need another one due to age.  Does not need further PSA screening due to age  Patient Counseling(The following topics were reviewed and/or handout was given):  -Nutrition: Stressed importance of moderation in sodium/caffeine intake, saturated fat and cholesterol, caloric balance, sufficient intake of fresh fruits, vegetables, and fiber.  -Stressed the importance of regular exercise.   -Substance Abuse: Discussed cessation/primary prevention of tobacco, alcohol, or other drug use; driving or other dangerous activities under the influence; availability of treatment for abuse.   -Injury prevention: Discussed safety belts, safety helmets, smoke detector, smoking near bedding or upholstery.   -Sexuality: Discussed sexually transmitted diseases, partner selection, use of condoms, avoidance of unintended pregnancy and contraceptive alternatives.   -Dental health: Discussed importance of regular tooth brushing, flossing, and dental visits.  -Health maintenance and immunizations reviewed. Please refer to Health maintenance section.  Return to care in 1 year for next preventative visit.     Subjective:  HPI:  He has no acute complaints today.   Lifestyle Factors: Diet: No specific diets or eating plans.  Exercise: Likes to play golf. Has been active with rehab for his shoulder surgery.   His chronic medical conditions are outlined below:  # Hypothyroidism - On synthroid 159mcg daily and tolerating well - ROS: No reported diarrhea or constipation  # Dyslipidemia - On crestor 20mg  daily and tolerating well - ROS: No reported mylagias  # BPH - On flomax 0.4mg  twice per daily and tolerating well  # Osteoarthritis  -   # GERD - Uses OTC heartburn meds as needed.   Depression screen PHQ 2/9 11/23/2018  Decreased Interest 0  Down, Depressed, Hopeless 0  PHQ - 2 Score 0    There are no preventive care reminders to display for this patient.   ROS: Per HPI, otherwise a complete review of systems was negative.   PMH:  The following were reviewed and entered/updated in epic: Past Medical History:  Diagnosis Date  . Arthritis   . Hx of cardiovascular stress test    ETT-Myoview (05/2013):  diaph atten, no ischemia, EF 63%; Low Risk  . Hyperlipidemia   . Migraines    Patient Active Problem List   Diagnosis Date Noted  . Hyperglycemia 11/23/2018  . Carotid artery disease ([place]) 04/02/2018  . Migraines 04/02/2018  . BPH (benign prostatic hyperplasia) 04/02/2018  . Hypothyroidism due to Hashimoto's thyroiditis 12/21/2017  . Heme positive stool 12/21/2017  . Dyslipidemia 12/14/2017  . GERD (gastroesophageal reflux disease) 12/14/2017  . Osteoarthritis 12/14/2017   Past Surgical History:  Procedure Laterality  Date  . CATARACT EXTRACTION  04/2018  . EYE SURGERY    . TREATMENT FISTULA ANAL      Family History  Problem Relation Age of Onset  . Lung cancer Mother   . Heart attack Father   . High  Cholesterol Father   . Diabetes Sister   . Colon cancer Neg Hx     Medications- reviewed and updated Current Outpatient Medications  Medication Sig Dispense Refill  . co-enzyme Q-10 30 MG capsule Take 30 mg by mouth every morning.    Marland Kitchen. glucosamine-chondroitin 500-400 MG tablet Take 1 tablet by mouth 2 (two) times daily.    . Multiple Vitamins-Minerals (MULTIVITAMIN WITH MINERALS) tablet Take 1 tablet by mouth every morning.    . raNITIdine HCl (ACID CONTROL PO) Take by mouth.    . Red Yeast Rice Extract (RED YEAST RICE PO) Take 1 tablet by mouth 2 (two) times daily.    . rosuvastatin (CRESTOR) 20 MG tablet Take 1 tablet (20 mg total) by mouth daily. 90 tablet 3  . tamsulosin (FLOMAX) 0.4 MG CAPS capsule Take 1 capsule (0.4 mg total) by mouth 2 (two) times daily. 180 capsule 3  . levothyroxine (SYNTHROID) 100 MCG tablet Take 1 tablet (100 mcg total) by mouth daily. 30 tablet 2   No current facility-administered medications for this visit.     Allergies-reviewed and updated No Known Allergies  Social History   Socioeconomic History  . Marital status: Divorced    Spouse name: Not on file  . Number of children: Not on file  . Years of education: Not on file  . Highest education level: Not on file  Occupational History  . Not on file  Social Needs  . Financial resource strain: Not on file  . Food insecurity    Worry: Not on file    Inability: Not on file  . Transportation needs    Medical: Not on file    Non-medical: Not on file  Tobacco Use  . Smoking status: Never Smoker  . Smokeless tobacco: Former NeurosurgeonUser    Types: Chew  Substance and Sexual Activity  . Alcohol use: Yes    Alcohol/week: 1.0 standard drinks    Types: 1 Cans of beer per week    Comment: social  . Drug use: No  . Sexual activity: Not on file  Lifestyle  . Physical activity    Days per week: Not on file    Minutes per session: Not on file  . Stress: Not on file  Relationships  . Social Wellsite geologistconnections     Talks on phone: Not on file    Gets together: Not on file    Attends religious service: Not on file    Active member of club or organization: Not on file    Attends meetings of clubs or organizations: Not on file    Relationship status: Not on file  Other Topics Concern  . Not on file  Social History Narrative  . Not on file        Objective:  Physical Exam: BP 124/66   Pulse 70   Temp (!) 97.2 F (36.2 C)   Ht 5\' 7"  (1.702 m)   Wt 180 lb (81.6 kg)   SpO2 97%   BMI 28.19 kg/m   Body mass index is 28.19 kg/m. Wt Readings from Last 3 Encounters:  11/23/18 180 lb (81.6 kg)  09/14/18 182 lb 3.2 oz (82.6 kg)  04/02/18 184 lb (83.5 kg)  Gen: NAD, resting comfortably HEENT: TMs normal bilaterally. OP clear. No thyromegaly noted.  CV: RRR with no murmurs appreciated Pulm: NWOB, CTAB with no crackles, wheezes, or rhonchi GI: Normal bowel sounds present. Soft, Nontender, Nondistended. MSK: no edema, cyanosis, or clubbing noted Skin: warm, dry Neuro: CN2-12 grossly intact. Strength 5/5 in upper and lower extremities. Reflexes symmetric and intact bilaterally.  Psych: Normal affect and thought content      M. [name], MD 11/23/2018 8:48 AM

## 2018-11-23 NOTE — Assessment & Plan Note (Signed)
Fasting glucose 107.  A1c 5.9.  Discussed lifestyle modifications.  Recheck in 1 year.

## 2018-11-23 NOTE — Assessment & Plan Note (Signed)
Stable. Continue flomax 0.4mg  twice daily.

## 2018-11-23 NOTE — Assessment & Plan Note (Signed)
Last lipid panel at goal.  Continue Crestor 20 mg daily.

## 2018-11-23 NOTE — Assessment & Plan Note (Signed)
Continue over-the-counter heartburn meds as needed.

## 2018-11-23 NOTE — Patient Instructions (Signed)
Gary Willis , Thank you for taking time to come for your Medicare Wellness Visit. I appreciate your ongoing commitment to your health goals. Please review the following plan we discussed and let me know if I can assist you in the future.   Screening recommendations/referrals: Colorectal Screening: up to date; last colonoscopy 06/11/16  Vision and Dental Exams: Recommended annual ophthalmology exams for early detection of glaucoma and other disorders of the eye Recommended annual dental exams for proper oral hygiene  Vaccinations: Influenza vaccine: completed 09/14/18 Pneumococcal vaccine: up to date; last 06/15/13 Tdap vaccine: recommended; Please call your insurance company to determine your out of pocket expense. You may receive this vaccine at your local pharmacy or Health Dept. Shingles vaccine: Please call your insurance company to determine your out of pocket expense for the Shingrix vaccine. You may receive this vaccine at your local pharmacy.  Advanced directives: Advance directives discussed with you today. I have provided a copy for you to complete at home and have notarized. Once this is complete please bring a copy in to our office so we can scan it into your chart.  Goals: Recommend to drink at least 6-8 8oz glasses of water per day and consume a balanced diet rich in fresh fruits and vegetables.   Next appointment: Please schedule your Annual Wellness Visit with your Nurse Health Advisor in one year.  Preventive Care 17 Years and Older, Male Preventive care refers to lifestyle choices and visits with your health care provider that can promote health and wellness. What does preventive care include?  A yearly physical exam. This is also called an annual well check.  Dental exams once or twice a year.  Routine eye exams. Ask your health care provider how often you should have your eyes checked.  Personal lifestyle choices, including:  Daily care of your teeth and  gums.  Regular physical activity.  Eating a healthy diet.  Avoiding tobacco and drug use.  Limiting alcohol use.  Practicing safe sex.  Taking low doses of aspirin every day if recommended by your health care provider..  Taking vitamin and mineral supplements as recommended by your health care provider. What happens during an annual well check? The services and screenings done by your health care provider during your annual well check will depend on your age, overall health, lifestyle risk factors, and family history of disease. Counseling  Your health care provider may ask you questions about your:  Alcohol use.  Tobacco use.  Drug use.  Emotional well-being.  Home and relationship well-being.  Sexual activity.  Eating habits.  History of falls.  Memory and ability to understand (cognition).  Work and work Statistician. Screening  You may have the following tests or measurements:  Height, weight, and BMI.  Blood pressure.  Lipid and cholesterol levels. These may be checked every 5 years, or more frequently if you are over 70 years old.  Skin check.  Lung cancer screening. You may have this screening every year starting at age 64 if you have a 30-pack-year history of smoking and currently smoke or have quit within the past 15 years.  Fecal occult blood test (FOBT) of the stool. You may have this test every year starting at age 75.  Flexible sigmoidoscopy or colonoscopy. You may have a sigmoidoscopy every 5 years or a colonoscopy every 10 years starting at age 90.  Prostate cancer screening. Recommendations will vary depending on your family history and other risks.  Hepatitis C blood test.  Hepatitis B blood test.  Sexually transmitted disease (STD) testing.  Diabetes screening. This is done by checking your blood sugar (glucose) after you have not eaten for a while (fasting). You may have this done every 1-3 years.  Abdominal aortic aneurysm (AAA)  screening. You may need this if you are a current or former smoker.  Osteoporosis. You may be screened starting at age 72 if you are at high risk. Talk with your health care provider about your test results, treatment options, and if necessary, the need for more tests. Vaccines  Your health care provider may recommend certain vaccines, such as:  Influenza vaccine. This is recommended every year.  Tetanus, diphtheria, and acellular pertussis (Tdap, Td) vaccine. You may need a Td booster every 10 years.  Zoster vaccine. You may need this after age 22.  Pneumococcal 13-valent conjugate (PCV13) vaccine. One dose is recommended after age 45.  Pneumococcal polysaccharide (PPSV23) vaccine. One dose is recommended after age 4. Talk to your health care provider about which screenings and vaccines you need and how often you need them. This information is not intended to replace advice given to you by your health care provider. Make sure you discuss any questions you have with your health care provider. Document Released: 01/26/2015 Document Revised: 09/19/2015 Document Reviewed: 10/31/2014 Elsevier Interactive Patient Education  2017 [employer] Prevention in the Home Falls can cause injuries. They can happen to people of all ages. There are many things you can do to make your home safe and to help prevent falls. What can I do on the outside of my home?  Regularly fix the edges of walkways and driveways and fix any cracks.  Remove anything that might make you trip as you walk through a door, such as a raised step or threshold.  Trim any bushes or trees on the path to your home.  Use bright outdoor lighting.  Clear any walking paths of anything that might make someone trip, such as rocks or tools.  Regularly check to see if handrails are loose or broken. Make sure that both sides of any steps have handrails.  Any raised decks and porches should have guardrails on the  edges.  Have any leaves, snow, or ice cleared regularly.  Use sand or salt on walking paths during winter.  Clean up any spills in your garage right away. This includes oil or grease spills. What can I do in the bathroom?  Use night lights.  Install grab bars by the toilet and in the tub and shower. Do not use towel bars as grab bars.  Use non-skid mats or decals in the tub or shower.  If you need to sit down in the shower, use a plastic, non-slip stool.  Keep the floor dry. Clean up any water that spills on the floor as soon as it happens.  Remove soap buildup in the tub or shower regularly.  Attach bath mats securely with double-sided non-slip rug tape.  Do not have throw rugs and other things on the floor that can make you trip. What can I do in the bedroom?  Use night lights.  Make sure that you have a light by your bed that is easy to reach.  Do not use any sheets or blankets that are too big for your bed. They should not hang down onto the floor.  Have a firm chair that has side arms. You can use this for support while you get dressed.  Do not  have throw rugs and other things on the floor that can make you trip. What can I do in the kitchen?  Clean up any spills right away.  Avoid walking on wet floors.  Keep items that you use a lot in easy-to-reach places.  If you need to reach something above you, use a strong step stool that has a grab bar.  Keep electrical cords out of the way.  Do not use floor polish or wax that makes floors slippery. If you must use wax, use non-skid floor wax.  Do not have throw rugs and other things on the floor that can make you trip. What can I do with my stairs?  Do not leave any items on the stairs.  Make sure that there are handrails on both sides of the stairs and use them. Fix handrails that are broken or loose. Make sure that handrails are as long as the stairways.  Check any carpeting to make sure that it is firmly  attached to the stairs. Fix any carpet that is loose or worn.  Avoid having throw rugs at the top or bottom of the stairs. If you do have throw rugs, attach them to the floor with carpet tape.  Make sure that you have a light switch at the top of the stairs and the bottom of the stairs. If you do not have them, ask someone to add them for you. What else can I do to help prevent falls?  Wear shoes that:  Do not have high heels.  Have rubber bottoms.  Are comfortable and fit you well.  Are closed at the toe. Do not wear sandals.  If you use a stepladder:  Make sure that it is fully opened. Do not climb a closed stepladder.  Make sure that both sides of the stepladder are locked into place.  Ask someone to hold it for you, if possible.  Clearly mark and make sure that you can see:  Any grab bars or handrails.  First and last steps.  Where the edge of each step is.  Use tools that help you move around (mobility aids) if they are needed. These include:  Canes.  Walkers.  Scooters.  Crutches.  Turn on the lights when you go into a dark area. Replace any light bulbs as soon as they burn out.  Set up your furniture so you have a clear path. Avoid moving your furniture around.  If any of your floors are uneven, fix them.  If there are any pets around you, be aware of where they are.  Review your medicines with your doctor. Some medicines can make you feel dizzy. This can increase your chance of falling. Ask your doctor what other things that you can do to help prevent falls. This information is not intended to replace advice given to you by your health care provider. Make sure you discuss any questions you have with your health care provider. Document Released: 10/26/2008 Document Revised: 06/07/2015 Document Reviewed: 02/03/2014 Elsevier Interactive Patient Education  2017 [employer].

## 2018-12-08 ENCOUNTER — Other Ambulatory Visit: Payer: Self-pay

## 2018-12-08 ENCOUNTER — Other Ambulatory Visit: Payer: Self-pay | Admitting: Family Medicine

## 2018-12-08 ENCOUNTER — Telehealth: Payer: Self-pay | Admitting: Family Medicine

## 2018-12-08 MED ORDER — LEVOTHYROXINE SODIUM 100 MCG PO TABS: 100.0000 ug | ORAL_TABLET | Freq: Every day | ORAL | 1 refills | Status: DC

## 2018-12-08 NOTE — Telephone Encounter (Signed)
p 

## 2018-12-08 NOTE — Telephone Encounter (Signed)
°  LAST APPOINTMENT DATE: @@LASTENCT @  NEXT APPOINTMENT DATE:@Visit  date not found  MEDICATION:EUTHYROX 100 MCG tablet  PHARMACY: [facility] [address], [place] - [place] [address] [phone] (Phone) [phone] (Fax)   *PATIENT WILL BE OUT O MEDICINE FRI  **Let patient know to contact pharmacy at the end of the day to make sure medication is ready. **  ** Please notify patient to allow 48-72 hours to process**  **Encourage patient to contact the pharmacy for refills or they can request refills through [facility]**  CLINICAL FILLS OUT ALL BELOW:   LAST REFILL:  QTY:  REFILL DATE:    OTHER COMMENTS:    Okay for refill?  Please advise

## 2018-12-08 NOTE — Telephone Encounter (Signed)
Rx sent 

## 2019-01-26 DIAGNOSIS — H25012 Cortical age-related cataract, left eye: Secondary | ICD-10-CM | POA: Diagnosis not present

## 2019-01-26 DIAGNOSIS — H0012 Chalazion right lower eyelid: Secondary | ICD-10-CM | POA: Diagnosis not present

## 2019-02-04 ENCOUNTER — Other Ambulatory Visit: Payer: Self-pay | Admitting: Family Medicine

## 2019-02-10 ENCOUNTER — Ambulatory Visit: Payer: Medicare Other

## 2019-02-11 ENCOUNTER — Ambulatory Visit: Payer: Medicare Other

## 2019-02-18 ENCOUNTER — Ambulatory Visit: Payer: Medicare PPO | Attending: Internal Medicine

## 2019-02-18 DIAGNOSIS — Z23 Encounter for immunization: Secondary | ICD-10-CM | POA: Insufficient documentation

## 2019-02-18 NOTE — Progress Notes (Signed)
   Covid-19 Vaccination Clinic  Name:  Gary Willis    MRN: [id number] DOB: Apr 04, 1940  02/18/2019  [name] was observed post Covid-19 immunization for 15 minutes without incidence. He was provided with Vaccine Information Sheet and instruction to access the V-Safe system.   [name] was instructed to call 911 with any severe reactions post vaccine: Marland Kitchen Difficulty breathing  . Swelling of your face and throat  . A fast heartbeat  . A bad rash all over your body  . Dizziness and weakness    Immunizations Administered    Name Date Dose VIS Date Route   Pfizer COVID-19 Vaccine 02/18/2019  3:35 PM 0.3 mL 12/24/2018 Intramuscular   Manufacturer: [employer], [employer]   Lot: [id number]   NDC: [address]-0

## 2019-02-21 ENCOUNTER — Ambulatory Visit: Payer: Medicare Other

## 2019-02-22 ENCOUNTER — Ambulatory Visit: Payer: Medicare Other

## 2019-03-07 ENCOUNTER — Other Ambulatory Visit: Payer: Self-pay | Admitting: Family Medicine

## 2019-03-15 ENCOUNTER — Telehealth: Payer: Self-pay | Admitting: Family Medicine

## 2019-03-15 ENCOUNTER — Ambulatory Visit: Payer: Medicare PPO | Attending: Internal Medicine

## 2019-03-15 ENCOUNTER — Other Ambulatory Visit: Payer: Self-pay

## 2019-03-15 DIAGNOSIS — Z23 Encounter for immunization: Secondary | ICD-10-CM | POA: Insufficient documentation

## 2019-03-15 DIAGNOSIS — E038 Other specified hypothyroidism: Secondary | ICD-10-CM

## 2019-03-15 DIAGNOSIS — E063 Autoimmune thyroiditis: Secondary | ICD-10-CM

## 2019-03-15 NOTE — Progress Notes (Signed)
   Covid-19 Vaccination Clinic  Name:  Gary Willis    MRN: [id number] DOB: 06-Mar-1940  03/15/2019  [name] was observed post Covid-19 immunization for 15 minutes without incident. He was provided with Vaccine Information Sheet and instruction to access the V-Safe system.   [name] was instructed to call 911 with any severe reactions post vaccine: Marland Kitchen Difficulty breathing  . Swelling of face and throat  . A fast heartbeat  . A bad rash all over body  . Dizziness and weakness   Immunizations Administered    Name Date Dose VIS Date Route   Pfizer COVID-19 Vaccine 03/15/2019  2:32 PM 0.3 mL 12/24/2018 Intramuscular   Manufacturer: [employer], [employer]   Lot: [id number]   NDC: [address]-1

## 2019-03-15 NOTE — Telephone Encounter (Signed)
Scheduled lab appt.

## 2019-03-15 NOTE — Telephone Encounter (Signed)
Pt came into office requesting to have lab work done. Pt states he does not feel right and wants to have his thyroid levels checked. Please advise.

## 2019-03-15 NOTE — Telephone Encounter (Signed)
Please put patient on the lab schedule for tomorrow morning you don't need to call him

## 2019-03-16 ENCOUNTER — Other Ambulatory Visit (INDEPENDENT_AMBULATORY_CARE_PROVIDER_SITE_OTHER): Payer: Medicare PPO

## 2019-03-16 ENCOUNTER — Other Ambulatory Visit: Payer: Self-pay

## 2019-03-16 DIAGNOSIS — E063 Autoimmune thyroiditis: Secondary | ICD-10-CM

## 2019-03-16 DIAGNOSIS — E038 Other specified hypothyroidism: Secondary | ICD-10-CM

## 2019-03-16 LAB — TSH: TSH: 4.52 u[IU]/mL — ABNORMAL HIGH (ref 0.35–4.50)

## 2019-03-16 MED ORDER — LEVOTHYROXINE SODIUM 112 MCG PO TABS: 112.0000 ug | ORAL_TABLET | Freq: Every day | ORAL | 0 refills | Status: DC

## 2019-03-16 NOTE — Progress Notes (Signed)
Please inform patient of the following:  TSH slightly above goal. Recommend increasing to daily and we can recheck in 4-6 weeks. Please send in new rx.   Katina Degree. [name], MD 03/16/2019 11:58 AM

## 2019-04-11 ENCOUNTER — Other Ambulatory Visit: Payer: Self-pay | Admitting: Family Medicine

## 2019-04-12 DIAGNOSIS — H0012 Chalazion right lower eyelid: Secondary | ICD-10-CM | POA: Diagnosis not present

## 2019-05-30 ENCOUNTER — Other Ambulatory Visit (INDEPENDENT_AMBULATORY_CARE_PROVIDER_SITE_OTHER): Payer: Medicare PPO

## 2019-05-30 ENCOUNTER — Telehealth (INDEPENDENT_AMBULATORY_CARE_PROVIDER_SITE_OTHER): Payer: Medicare PPO | Admitting: Family Medicine

## 2019-05-30 ENCOUNTER — Encounter: Payer: Self-pay | Admitting: Family Medicine

## 2019-05-30 ENCOUNTER — Other Ambulatory Visit: Payer: Self-pay

## 2019-05-30 VITALS — BP 117/72 | Ht 67.0 in | Wt 180.0 lb

## 2019-05-30 DIAGNOSIS — E038 Other specified hypothyroidism: Secondary | ICD-10-CM

## 2019-05-30 DIAGNOSIS — E063 Autoimmune thyroiditis: Secondary | ICD-10-CM | POA: Diagnosis not present

## 2019-05-30 NOTE — Progress Notes (Signed)
   Gary Willis is a 79 y.o. male who presents today for a telephone visit.  Assessment/Plan:  Chronic Problems Addressed Today: Hypothyroidism due to Hashimoto's thyroiditis Continue Synthroid 112 mcg daily.  Will check TSH.  If continues to have loose stools and TSH is within normal limits would consider referral to GI.     Subjective:  HPI:  Patient would like to have thyroid level checked.  Couple months ago increase Synthroid dose to 112 mcg daily.  Did well for a week or so however feels like he is back to where he was before.         Objective/Observations   NAD  Telephone Visit   I connected with Gary Willis on 05/30/19 at  3:40 PM EDT via telephone and verified that I am speaking with the correct person using two identifiers. I discussed the limitations of evaluation and management by telemedicine and the availability of in person appointments. The patient expressed understanding and agreed to proceed.   Patient location: Home Provider location: [place] [employer] [facility] Persons participating in the virtual visit: Myself and Patient  A total of 6 minutes were spent on medical discussion.      Katina Degree. [name], MD 05/30/2019 2:25 PM

## 2019-05-30 NOTE — Assessment & Plan Note (Signed)
Continue Synthroid 112 mcg daily.  Will check TSH.  If continues to have loose stools and TSH is within normal limits would consider referral to GI.

## 2019-05-31 LAB — TSH: TSH: 4.19 u[IU]/mL (ref 0.35–4.50)

## 2019-06-01 NOTE — Progress Notes (Signed)
Please inform patient of the following:  TSH slightly lower than last time, though not much of a difference. Recommend increasing synthroid dose to daily and we can recheck in 4-6 weeks. Please send in new rx and place future labs. Would like for him to let us know if his symptoms are not improving.  Gary Willis. [name], MD 06/01/2019 9:23 AM

## 2019-06-03 ENCOUNTER — Other Ambulatory Visit: Payer: Self-pay | Admitting: *Deleted

## 2019-06-03 ENCOUNTER — Telehealth: Payer: Self-pay

## 2019-06-03 DIAGNOSIS — E038 Other specified hypothyroidism: Secondary | ICD-10-CM

## 2019-06-03 MED ORDER — LEVOTHYROXINE SODIUM 125 MCG PO TABS
125.0000 ug | ORAL_TABLET | Freq: Every day | ORAL | 1 refills | Status: DC
Start: 2019-06-03 — End: 2019-10-11

## 2019-06-03 NOTE — Telephone Encounter (Signed)
Patient returning messed call regarding labs.

## 2019-06-03 NOTE — Telephone Encounter (Signed)
Lab results given to Pt

## 2019-06-03 NOTE — Progress Notes (Signed)
tsh

## 2019-08-17 ENCOUNTER — Other Ambulatory Visit: Payer: Self-pay | Admitting: Family Medicine

## 2019-08-17 ENCOUNTER — Other Ambulatory Visit: Payer: Medicare PPO

## 2019-08-17 ENCOUNTER — Other Ambulatory Visit: Payer: Self-pay

## 2019-08-17 DIAGNOSIS — E038 Other specified hypothyroidism: Secondary | ICD-10-CM | POA: Diagnosis not present

## 2019-08-17 DIAGNOSIS — E063 Autoimmune thyroiditis: Secondary | ICD-10-CM | POA: Diagnosis not present

## 2019-08-17 LAB — TSH: TSH: 2.88 mIU/L (ref 0.40–4.50)

## 2019-08-17 NOTE — Addendum Note (Signed)
Addended by: Mathews Argyle on: 08/17/2019 02:49 PM   Modules accepted: Orders

## 2019-08-18 ENCOUNTER — Other Ambulatory Visit: Payer: Self-pay | Admitting: *Deleted

## 2019-08-18 NOTE — Progress Notes (Signed)
Please inform patient of the following:  TSH level is normal.  Muriel Hannold M. [name], MD 08/18/2019 12:05 PM

## 2019-10-05 ENCOUNTER — Telehealth: Payer: Self-pay

## 2019-10-05 DIAGNOSIS — E063 Autoimmune thyroiditis: Secondary | ICD-10-CM

## 2019-10-05 NOTE — Telephone Encounter (Signed)
Pt believes that is thyroid medication dosage may be to high. He is experiencing excessive sweating at night, as well as chills throughout the days. He is scheduled for OCT 1 to come in, but hoping to talk to Dr. [name] sooner.

## 2019-10-06 NOTE — Telephone Encounter (Signed)
Sooner OV needed?

## 2019-10-07 NOTE — Telephone Encounter (Signed)
Pt scheduled  

## 2019-10-07 NOTE — Telephone Encounter (Signed)
Ok for him to come in and check tsh. Please place future order.

## 2019-10-07 NOTE — Telephone Encounter (Signed)
Please call pt and schedule lab appt only. I have placed orders for thyroid check per Dr. [name].

## 2019-10-10 ENCOUNTER — Other Ambulatory Visit: Payer: Medicare PPO

## 2019-10-10 ENCOUNTER — Other Ambulatory Visit: Payer: Self-pay

## 2019-10-10 DIAGNOSIS — E038 Other specified hypothyroidism: Secondary | ICD-10-CM

## 2019-10-10 DIAGNOSIS — E063 Autoimmune thyroiditis: Secondary | ICD-10-CM | POA: Diagnosis not present

## 2019-10-11 ENCOUNTER — Other Ambulatory Visit: Payer: Self-pay

## 2019-10-11 LAB — TSH: TSH: 4.95 mIU/L — ABNORMAL HIGH (ref 0.40–4.50)

## 2019-10-11 MED ORDER — LEVOTHYROXINE SODIUM 137 MCG PO TABS
137.0000 ug | ORAL_TABLET | Freq: Every day | ORAL | 0 refills | Status: DC
Start: 2019-10-11 — End: 2019-10-14

## 2019-10-11 NOTE — Progress Notes (Signed)
Please inform patient of the following:  Thyroid level is actually a bit low based on our test. Recommend increasing dose to daily and we can recheck in 4-6 weeks.  Gary Willis. [name], MD 10/11/2019 1:05 PM

## 2019-10-14 ENCOUNTER — Ambulatory Visit (INDEPENDENT_AMBULATORY_CARE_PROVIDER_SITE_OTHER): Payer: Medicare PPO | Admitting: Family Medicine

## 2019-10-14 ENCOUNTER — Encounter: Payer: Self-pay | Admitting: Family Medicine

## 2019-10-14 ENCOUNTER — Other Ambulatory Visit: Payer: Self-pay

## 2019-10-14 VITALS — BP 134/71 | HR 60 | Temp 98.1°F | Ht 67.0 in | Wt 182.0 lb

## 2019-10-14 DIAGNOSIS — E038 Other specified hypothyroidism: Secondary | ICD-10-CM

## 2019-10-14 DIAGNOSIS — E063 Autoimmune thyroiditis: Secondary | ICD-10-CM | POA: Diagnosis not present

## 2019-10-14 DIAGNOSIS — Z23 Encounter for immunization: Secondary | ICD-10-CM | POA: Diagnosis not present

## 2019-10-14 MED ORDER — LEVOTHYROXINE SODIUM 100 MCG PO TABS: 100.0000 ug | ORAL_TABLET | Freq: Every day | ORAL | 3 refills | Status: DC

## 2019-10-14 NOTE — Progress Notes (Signed)
   Gary Willis is a 79 y.o. male who presents today for an office visit.  Assessment/Plan:  New/Acute Problems: Lip Lesion Consistent with HSV 1 outbreak.  Given that no new lesions are present do not think he needs Valtrex at this point.  Continue over-the-counter topical creams as he has been doing.  He will let me know if it is not improving in the next week or 2.  Chronic Problems Addressed Today: Hypothyroidism due to Hashimoto's thyroiditis Several signs of hyperthyroidism including sweatiness, jitteriness, and heat intolerance.  He recently modified the way he was taking his medication.  Will decrease dose back to 100 mcg daily and he will take as he has been doing.  We will recheck in 4 to 6 weeks.  He will let me know if this causes any adverse reactions.  Flu vaccine given today.     Subjective:  HPI:  Patient here today for thyroid follow-up.  States he has been getting too much.  He recently changed the way that he is taking his medication and thinks that he is absorbing more than he did previously.  He would like to reduce dose back to 100 mcg daily.  He has also had a lip lesion for the last week or so.  Started after being out of the sun.  Thinks it may been a fever blister.  Has tried over-the-counter ointments with some improvement.       Objective:  Physical Exam: BP 134/71   Pulse 60   Temp 98.1 F (36.7 C) (Temporal)   Ht 5\' 7"  (1.702 m)   Wt 182 lb (82.6 kg)   SpO2 97%   BMI 28.51 kg/m   Gen: No acute distress, resting comfortably HEENT: Lower lip with approximately 1 cm vesicular lesion CV: Regular rate and rhythm with no murmurs appreciated Pulm: Normal work of breathing, clear to auscultation bilaterally with no crackles, wheezes, or rhonchi Neuro: Grossly normal, moves all extremities Psych: Normal affect and thought content      Blayke Cordrey M. , MD 10/14/2019 1:48 PM

## 2019-10-14 NOTE — Assessment & Plan Note (Signed)
Several signs of hyperthyroidism including sweatiness, jitteriness, and heat intolerance.  He recently modified the way he was taking his medication.  Will decrease dose back to 100 mcg daily and he will take as he has been doing.  We will recheck in 4 to 6 weeks.  He will let me know if this causes any adverse reactions.

## 2019-10-14 NOTE — Patient Instructions (Signed)
It was very nice to see you today!  Go back to 100 mcg on your Synthroid dose.  Please come back in 4 to 6 weeks to have this rechecked.  Let me know if you have any adverse reactions to the dose change.  Take care, Dr [name]  Please try these tips to maintain a healthy lifestyle:   Eat at least 3 REAL meals and 1-2 snacks per day.  Aim for no more than 5 hours between eating.  If you eat breakfast, please do so within one hour of getting up.    Each meal should contain half fruits/vegetables, one quarter protein, and one quarter carbs (no bigger than a computer mouse)   Cut down on sweet beverages. This includes juice, soda, and sweet tea.     Drink at least 1 glass of water with each meal and aim for at least 8 glasses per day   Exercise at least 150 minutes every week.

## 2019-11-09 DIAGNOSIS — H524 Presbyopia: Secondary | ICD-10-CM | POA: Diagnosis not present

## 2019-11-09 DIAGNOSIS — H2512 Age-related nuclear cataract, left eye: Secondary | ICD-10-CM | POA: Diagnosis not present

## 2019-11-09 DIAGNOSIS — Z961 Presence of intraocular lens: Secondary | ICD-10-CM | POA: Diagnosis not present

## 2019-11-09 DIAGNOSIS — H5211 Myopia, right eye: Secondary | ICD-10-CM | POA: Diagnosis not present

## 2019-11-15 ENCOUNTER — Other Ambulatory Visit: Payer: Self-pay | Admitting: Family Medicine

## 2019-12-13 ENCOUNTER — Telehealth: Payer: Medicare PPO | Admitting: Family Medicine

## 2019-12-14 ENCOUNTER — Encounter: Payer: Self-pay | Admitting: Family Medicine

## 2019-12-14 ENCOUNTER — Telehealth (INDEPENDENT_AMBULATORY_CARE_PROVIDER_SITE_OTHER): Payer: Medicare PPO | Admitting: Family Medicine

## 2019-12-14 VITALS — Ht 67.0 in

## 2019-12-14 DIAGNOSIS — H547 Unspecified visual loss: Secondary | ICD-10-CM | POA: Diagnosis not present

## 2019-12-14 DIAGNOSIS — E038 Other specified hypothyroidism: Secondary | ICD-10-CM | POA: Diagnosis not present

## 2019-12-14 DIAGNOSIS — E063 Autoimmune thyroiditis: Secondary | ICD-10-CM | POA: Diagnosis not present

## 2019-12-14 DIAGNOSIS — Z9849 Cataract extraction status, unspecified eye: Secondary | ICD-10-CM | POA: Diagnosis not present

## 2019-12-14 NOTE — Assessment & Plan Note (Signed)
Doing much better on synthroid daily. Will recheck TSH next blood draw.

## 2019-12-14 NOTE — Progress Notes (Signed)
   Gary Willis is a 79 y.o. male who presents today for a telephone visit.  Assessment/Plan:  New/Acute Problems: Decreased visual acuity Unable to perform exam today due to telephone encounter.  Will place referral to ophthalmology per patient request.  No reported red flags.  Chronic Problems Addressed Today: Hypothyroidism due to Hashimoto's thyroiditis Doing much better on synthroid daily. Will recheck TSH next blood draw.      Subjective:  HPI:  Patient with decreasing visual acuity in his right eye for the past several months.  Has a history of cataract extraction several years ago.  Has noticed more drive to the area as well.  He has been working with his optometrist but has not had much success.  He requests referral to ophthalmologist for further evaluation.  He is otherwise doing well.       Objective/Observations   NAD.   Telephone Visit   I connected with Gary Willis on 12/14/19 at 11:20 AM EST via telephone and verified that I am speaking with the correct person using two identifiers. I discussed the limitations of evaluation and management by telemedicine and the availability of in person appointments. The patient expressed understanding and agreed to proceed.   Patient location: Home Provider location: [facility] Persons participating in the virtual visit: Myself and Patient  A total of 13 minutes were spent on medical discussion.      Katina Degree. [name], MD 12/14/2019 11:51 AM

## 2020-01-02 ENCOUNTER — Ambulatory Visit (INDEPENDENT_AMBULATORY_CARE_PROVIDER_SITE_OTHER): Payer: Medicare PPO

## 2020-01-02 ENCOUNTER — Other Ambulatory Visit: Payer: Self-pay

## 2020-01-02 VITALS — BP 138/74 | HR 69 | Temp 97.4°F | Resp 20 | Wt 182.4 lb

## 2020-01-02 DIAGNOSIS — Z0000 Encounter for general adult medical examination without abnormal findings: Secondary | ICD-10-CM | POA: Diagnosis not present

## 2020-01-02 NOTE — Progress Notes (Signed)
Subjective:   Gary Willis is a 79 y.o. male who presents for Medicare Annual/Subsequent preventive examination.  Review of Systems     Cardiac Risk Factors include: advanced age (>34men, >46 women);hypertension;dyslipidemia;male gender     Objective:    Today's Vitals   01/02/20 0927  BP: 138/74  Pulse: 69  Resp: 20  Temp: (!) 97.4 F (36.3 C)  SpO2: 95%  Weight: 182 lb 6.4 oz (82.7 kg)   Body mass index is 28.57 kg/m.  Advanced Directives 01/02/2020 11/23/2018  Does Patient Have a Medical Advance Directive? Yes No  Type of Advance Directive Living will -  Would patient like information on creating a medical advance directive? - Yes (MAU/Ambulatory/Procedural Areas - Information given)    Current Medications (verified) Outpatient Encounter Medications as of 01/02/2020  Medication Sig  . cholecalciferol (VITAMIN D3) 25 MCG (1000 UNIT) tablet Take 1,000 Units by mouth daily.  Marland Kitchen co-enzyme Q-10 30 MG capsule Take 30 mg by mouth every morning.  Marland Kitchen glucosamine-chondroitin 500-400 MG tablet Take 1 tablet by mouth 2 (two) times daily.  Marland Kitchen ibuprofen (ADVIL) 100 MG/5ML suspension Take 200 mg by mouth every 4 (four) hours as needed.  Marland Kitchen levothyroxine (SYNTHROID) 100 MCG tablet Take 1 tablet (100 mcg total) by mouth daily before breakfast.  . Multiple Vitamins-Minerals (MULTIVITAMIN WITH MINERALS) tablet Take 1 tablet by mouth every morning.  . rosuvastatin (CRESTOR) 20 MG tablet Take 1 tablet by mouth once daily  . Sod Fluoride-Potassium Nitrate 1.1-5 % PSTE   . tamsulosin (FLOMAX) 0.4 MG CAPS capsule Take 1 capsule by mouth twice daily  . TURMERIC PO Take by mouth.  . Ascorbic Acid (VITAMIN C) 100 MG tablet Take 100 mg by mouth daily. (Patient not taking: Reported on 01/02/2020)  . [DISCONTINUED] Red Yeast Rice Extract (RED YEAST RICE PO) Take 1 tablet by mouth 2 (two) times daily. (Patient not taking: Reported on 01/02/2020)   No facility-administered encounter medications on  file as of 01/02/2020.    Allergies (verified) Patient has no known allergies.   History: Past Medical History:  Diagnosis Date  . Arthritis   . Hx of cardiovascular stress test    ETT-Myoview (05/2013):  diaph atten, no ischemia, EF 63%; Low Risk  . Hyperlipidemia   . Migraines    Past Surgical History:  Procedure Laterality Date  . CATARACT EXTRACTION  04/2018  . EYE SURGERY    . TREATMENT FISTULA ANAL     Family History  Problem Relation Age of Onset  . Lung cancer Mother   . Heart attack Father   . High Cholesterol Father   . Diabetes Sister   . Colon cancer Neg Hx    Social History   Socioeconomic History  . Marital status: Divorced    Spouse name: Not on file  . Number of children: Not on file  . Years of education: Not on file  . Highest education level: Not on file  Occupational History  . Occupation: retired  Tobacco Use  . Smoking status: Never Smoker  . Smokeless tobacco: Former Neurosurgeon    Types: Chew  Substance and Sexual Activity  . Alcohol use: Yes    Alcohol/week: 1.0 standard drink    Types: 1 Cans of beer per week    Comment: social  . Drug use: No  . Sexual activity: Not on file  Other Topics Concern  . Not on file  Social History Narrative  . Not on file   Social Determinants  of Health   Financial Resource Strain: Low Risk   . Difficulty of Paying Living Expenses: Not hard at all  Food Insecurity: No Food Insecurity  . Worried About Programme researcher, broadcasting/film/video in the Last Year: Never true  . Ran Out of Food in the Last Year: Never true  Transportation Needs: No Transportation Needs  . Lack of Transportation (Medical): No  . Lack of Transportation (Non-Medical): No  Physical Activity: Sufficiently Active  . Days of Exercise per Week: 5 days  . Minutes of Exercise per Session: 60 min  Stress: No Stress Concern Present  . Feeling of Stress : Not at all  Social Connections: Moderately Isolated  . Frequency of Communication with Friends and  Family: More than three times a week  . Frequency of Social Gatherings with Friends and Family: More than three times a week  . Attends Religious Services: Never  . Active Member of Clubs or Organizations: Yes  . Attends Banker Meetings: 1 to 4 times per year  . Marital Status: Divorced    Tobacco Counseling Counseling given: Not Answered   Clinical Intake:  Pre-visit preparation completed: Yes  Pain : No/denies pain     BMI - recorded: 28.57 Nutritional Risks: None Diabetes: No  How often do you need to have someone help you when you read instructions, pamphlets, or other written materials from your doctor or pharmacy?: 1 - Never  Diabetic?No  Interpreter Needed?: No  Information entered by :: Lanier Ensign, LPN   Activities of Daily Living In your present state of health, do you have any difficulty performing the following activities: 01/02/2020 12/14/2019  Hearing? N N  Vision? N Y  Comment - with Rt eye, after surgery  Difficulty concentrating or making decisions? Y N  Comment short term can be a challenge at times -  Walking or climbing stairs? N N  Dressing or bathing? N N  Doing errands, shopping? N N  Preparing Food and eating ? N -  Using the Toilet? N -  In the past six months, have you accidently leaked urine? N -  Do you have problems with loss of bowel control? N -  Managing your Medications? N -  Managing your Finances? N -  Housekeeping or managing your Housekeeping? N -  Some recent data might be hidden    Patient Care Team: Ardith Dark, MD as PCP - General (Family Medicine) Maris Berger, MD as Consulting Physician (Ophthalmology)  Indicate any recent Medical Services you may have received from other than [facility] providers in the past year (date may be approximate).     Assessment:   This is a routine wellness examination for Gary Willis.  Hearing/Vision screen  Hearing Screening   125Hz  250Hz  500Hz  1000Hz  2000Hz  3000Hz   4000Hz  6000Hz  8000Hz   Right ear:           Left ear:           Comments: Denies hearing issues   Vision Screening Comments: Pt follows uip with Dr appt 12/21 was seeing Dr  Dietary issues and exercise activities discussed: Current Exercise Habits: Home exercise routine;Structured exercise class, Type of exercise: walking;Other - see comments (pilates), Time (Minutes): > 60, Frequency (Times/Week): 5, Weekly Exercise (Minutes/Week): 0  Goals    . Patient Stated     Remain healthy and keep exercising       Depression Screen PHQ 2/9 Scores 01/02/2020 12/14/2019 11/23/2018 11/23/2018 12/14/2017  PHQ - 2 Score  0 0 0 0 0    Fall Risk Fall Risk  01/02/2020 11/23/2018 11/23/2018  Falls in the past year? 0 0 0  Number falls in past yr: 0 - -  Injury with Fall? 0 0 -  Risk for fall due to : Impaired vision - -  Follow up Falls prevention discussed Falls evaluation completed;Education provided;Falls prevention discussed -    FALL RISK PREVENTION PERTAINING TO THE HOME:  Any stairs in or around the home? No  If so, are there any without handrails? No  Home free of loose throw rugs in walkways, pet beds, electrical cords, etc? Yes  Adequate lighting in your home to reduce risk of falls? Yes   ASSISTIVE DEVICES UTILIZED TO PREVENT FALLS:  Life alert? No  Use of a cane, walker or w/c? No  Grab bars in the bathroom? No  Shower chair or bench in shower? No  Elevated toilet seat or a handicapped toilet? No   TIMED UP AND GO:  Was the test performed? Yes .  Length of time to ambulate 10 feet: 10 sec.   Gait steady and fast without use of assistive device  Cognitive Function:     6CIT Screen 11/23/2018  What Year? 0 points  What month? 0 points  What time? 0 points  Count back from 20 0 points  Months in reverse 0 points  Repeat phrase 0 points  Total Score 0    Immunizations Immunization History  Administered Date(s) Administered  . Fluad Quad(high  Dose 65+) 09/14/2018, 10/14/2019  . Influenza-Unspecified 10/13/2011, 11/08/2013, 12/06/2014, 10/02/2015, 11/27/2016, 09/24/2017, 10/14/2017  . PFIZER SARS-COV-2 Vaccination 02/18/2019, 03/15/2019  . Pneumococcal Conjugate-13 06/15/2013  . Pneumococcal Polysaccharide-23 10/06/2008  . Td 10/13/1997, 10/06/2008, 07/22/2019  . Zoster 09/09/2010  . Zoster Recombinat (Shingrix) 07/22/2019, 12/31/2019    TDAP status: Up to date  Flu Vaccine status: Up to date  Pneumococcal vaccine status: Up to date  Covid-19 vaccine status: Completed vaccines  Qualifies for Shingles Vaccine? Yes   Zostavax completed Yes   Shingrix Completed   Screening Tests Health Maintenance  Topic Date Due  . Hepatitis C Screening  01/16/2020 (Originally 20-Aug-1940)  . COVID-19 Vaccine (3 - Booster for Pfizer series) 01/18/2020 (Originally 09/15/2019)  . TETANUS/TDAP  07/21/2029  . INFLUENZA VACCINE  Completed  . PNA vac Low Risk Adult  Completed    Health Maintenance  There are no preventive care reminders to display for this patient.  Colorectal cancer screening: No longer required.    Additional Screening:  Hepatitis C Screening: does qualify  Vision Screening: Recommended annual ophthalmology exams for early detection of glaucoma and other disorders of the eye. Is the patient up to date with their annual eye exam?  Yes  Who is the provider or what is the name of the office in which the patient attends annual eye exams? Will have an appt with Dr Stephannie LiJason Sanders 01/03/20  Dental Screening: Recommended annual dental exams for proper oral hygiene  Community Resource Referral / Chronic Care Management: CRR required this visit?  No   CCM required this visit?  No      Plan:     I have personally reviewed and noted the following in the patient's chart:   . Medical and social history . Use of alcohol, tobacco or illicit drugs  . Current medications and supplements . Functional ability and  status . Nutritional status . Physical activity . Advanced directives . List of other physicians . Hospitalizations, surgeries, and  ER visits in previous 12 months . Vitals . Screenings to include cognitive, depression, and falls . Referrals and appointments  In addition, I have reviewed and discussed with patient certain preventive protocols, quality metrics, and best practice recommendations. A written personalized care plan for preventive services as well as general preventive health recommendations were provided to patient.     Marzella Schlein, LPN   [id number]   Nurse Notes: None

## 2020-01-02 NOTE — Patient Instructions (Addendum)
Mr. Gary Willis , Thank you for taking time to come for your Medicare Wellness Visit. I appreciate your ongoing commitment to your health goals. Please review the following plan we discussed and let me know if I can assist you in the future.   Screening recommendations/referrals: Colonoscopy: No longer required Recommended yearly ophthalmology/optometry visit for glaucoma screening and checkup Recommended yearly dental visit for hygiene and checkup  Vaccinations: Influenza vaccine: Done 10/14/19 Up to date Pneumococcal vaccine: Up to date Tdap vaccine: Up to date Shingles vaccine: 1st dose 07/22/19 and 2nd dose 12/31/19  Covid-19: Completed 2/5 & 03/15/19   Advanced directives: Please bring a copy of your health care power of attorney and living will to the office at your convenience.  Conditions/risks identified: maintain health and keep exercising  Next appointment: Follow up in one year for your annual wellness visit.   Preventive Care 78 Years and Older, Male Preventive care refers to lifestyle choices and visits with your health care provider that can promote health and wellness. What does preventive care include?  A yearly physical exam. This is also called an annual well check.  Dental exams once or twice a year.  Routine eye exams. Ask your health care provider how often you should have your eyes checked.  Personal lifestyle choices, including:  Daily care of your teeth and gums.  Regular physical activity.  Eating a healthy diet.  Avoiding tobacco and drug use.  Limiting alcohol use.  Practicing safe sex.  Taking low doses of aspirin every day.  Taking vitamin and mineral supplements as recommended by your health care provider. What happens during an annual well check? The services and screenings done by your health care provider during your annual well check will depend on your age, overall health, lifestyle risk factors, and family history of disease. Counseling   Your health care provider may ask you questions about your:  Alcohol use.  Tobacco use.  Drug use.  Emotional well-being.  Home and relationship well-being.  Sexual activity.  Eating habits.  History of falls.  Memory and ability to understand (cognition).  Work and work Astronomer. Screening  You may have the following tests or measurements:  Height, weight, and BMI.  Blood pressure.  Lipid and cholesterol levels. These may be checked every 5 years, or more frequently if you are over 72 years old.  Skin check.  Lung cancer screening. You may have this screening every year starting at age 78 if you have a 30-pack-year history of smoking and currently smoke or have quit within the past 15 years.  Fecal occult blood test (FOBT) of the stool. You may have this test every year starting at age 78.  Flexible sigmoidoscopy or colonoscopy. You may have a sigmoidoscopy every 5 years or a colonoscopy every 10 years starting at age 78.  Prostate cancer screening. Recommendations will vary depending on your family history and other risks.  Hepatitis C blood test.  Hepatitis B blood test.  Sexually transmitted disease (STD) testing.  Diabetes screening. This is done by checking your blood sugar (glucose) after you have not eaten for a while (fasting). You may have this done every 1-3 years.  Abdominal aortic aneurysm (AAA) screening. You may need this if you are a current or former smoker.  Osteoporosis. You may be screened starting at age 78 if you are at high risk. Talk with your health care provider about your test results, treatment options, and if necessary, the need for more tests. Vaccines  Your health care provider may recommend certain vaccines, such as:  Influenza vaccine. This is recommended every year.  Tetanus, diphtheria, and acellular pertussis (Tdap, Td) vaccine. You may need a Td booster every 10 years.  Zoster vaccine. You may need this after age  78.  Pneumococcal 13-valent conjugate (PCV13) vaccine. One dose is recommended after age 26.  Pneumococcal polysaccharide (PPSV23) vaccine. One dose is recommended after age 40. Talk to your health care provider about which screenings and vaccines you need and how often you need them. This information is not intended to replace advice given to you by your health care provider. Make sure you discuss any questions you have with your health care provider. Document Released: 01/26/2015 Document Revised: 09/19/2015 Document Reviewed: 10/31/2014 Elsevier Interactive Patient Education  2017 [place] Prevention in the Home Falls can cause injuries. They can happen to people of all ages. There are many things you can do to make your home safe and to help prevent falls. What can I do on the outside of my home?  Regularly fix the edges of walkways and driveways and fix any cracks.  Remove anything that might make you trip as you walk through a door, such as a raised step or threshold.  Trim any bushes or trees on the path to your home.  Use bright outdoor lighting.  Clear any walking paths of anything that might make someone trip, such as rocks or tools.  Regularly check to see if handrails are loose or broken. Make sure that both sides of any steps have handrails.  Any raised decks and porches should have guardrails on the edges.  Have any leaves, snow, or ice cleared regularly.  Use sand or salt on walking paths during winter.  Clean up any spills in your garage right away. This includes oil or grease spills. What can I do in the bathroom?  Use night lights.  Install grab bars by the toilet and in the tub and shower. Do not use towel bars as grab bars.  Use non-skid mats or decals in the tub or shower.  If you need to sit down in the shower, use a plastic, non-slip stool.  Keep the floor dry. Clean up any water that spills on the floor as soon as it happens.  Remove  soap buildup in the tub or shower regularly.  Attach bath mats securely with double-sided non-slip rug tape.  Do not have throw rugs and other things on the floor that can make you trip. What can I do in the bedroom?  Use night lights.  Make sure that you have a light by your bed that is easy to reach.  Do not use any sheets or blankets that are too big for your bed. They should not hang down onto the floor.  Have a firm chair that has side arms. You can use this for support while you get dressed.  Do not have throw rugs and other things on the floor that can make you trip. What can I do in the kitchen?  Clean up any spills right away.  Avoid walking on wet floors.  Keep items that you use a lot in easy-to-reach places.  If you need to reach something above you, use a strong step stool that has a grab bar.  Keep electrical cords out of the way.  Do not use floor polish or wax that makes floors slippery. If you must use wax, use non-skid floor wax.  Do  not have throw rugs and other things on the floor that can make you trip. What can I do with my stairs?  Do not leave any items on the stairs.  Make sure that there are handrails on both sides of the stairs and use them. Fix handrails that are broken or loose. Make sure that handrails are as long as the stairways.  Check any carpeting to make sure that it is firmly attached to the stairs. Fix any carpet that is loose or worn.  Avoid having throw rugs at the top or bottom of the stairs. If you do have throw rugs, attach them to the floor with carpet tape.  Make sure that you have a light switch at the top of the stairs and the bottom of the stairs. If you do not have them, ask someone to add them for you. What else can I do to help prevent falls?  Wear shoes that:  Do not have high heels.  Have rubber bottoms.  Are comfortable and fit you well.  Are closed at the toe. Do not wear sandals.  If you use a  stepladder:  Make sure that it is fully opened. Do not climb a closed stepladder.  Make sure that both sides of the stepladder are locked into place.  Ask someone to hold it for you, if possible.  Clearly mark and make sure that you can see:  Any grab bars or handrails.  First and last steps.  Where the edge of each step is.  Use tools that help you move around (mobility aids) if they are needed. These include:  Canes.  Walkers.  Scooters.  Crutches.  Turn on the lights when you go into a dark area. Replace any light bulbs as soon as they burn out.  Set up your furniture so you have a clear path. Avoid moving your furniture around.  If any of your floors are uneven, fix them.  If there are any pets around you, be aware of where they are.  Review your medicines with your doctor. Some medicines can make you feel dizzy. This can increase your chance of falling. Ask your doctor what other things that you can do to help prevent falls. This information is not intended to replace advice given to you by your health care provider. Make sure you discuss any questions you have with your health care provider. Document Released: 10/26/2008 Document Revised: 06/07/2015 Document Reviewed: 02/03/2014 Elsevier Interactive Patient Education  2017 [employer].

## 2020-01-03 DIAGNOSIS — H2512 Age-related nuclear cataract, left eye: Secondary | ICD-10-CM | POA: Diagnosis not present

## 2020-01-03 DIAGNOSIS — H43823 Vitreomacular adhesion, bilateral: Secondary | ICD-10-CM | POA: Diagnosis not present

## 2020-01-03 DIAGNOSIS — H04123 Dry eye syndrome of bilateral lacrimal glands: Secondary | ICD-10-CM | POA: Diagnosis not present

## 2020-01-03 DIAGNOSIS — H35423 Microcystoid degeneration of retina, bilateral: Secondary | ICD-10-CM | POA: Diagnosis not present

## 2020-01-11 ENCOUNTER — Encounter: Payer: Self-pay | Admitting: Family Medicine

## 2020-02-14 ENCOUNTER — Other Ambulatory Visit: Payer: Self-pay | Admitting: Family Medicine

## 2020-04-09 ENCOUNTER — Other Ambulatory Visit: Payer: Self-pay

## 2020-04-09 ENCOUNTER — Other Ambulatory Visit: Payer: Self-pay | Admitting: *Deleted

## 2020-04-09 ENCOUNTER — Telehealth: Payer: Self-pay

## 2020-04-09 ENCOUNTER — Other Ambulatory Visit (INDEPENDENT_AMBULATORY_CARE_PROVIDER_SITE_OTHER): Payer: Medicare PPO

## 2020-04-09 DIAGNOSIS — E038 Other specified hypothyroidism: Secondary | ICD-10-CM

## 2020-04-09 DIAGNOSIS — E063 Autoimmune thyroiditis: Secondary | ICD-10-CM

## 2020-04-09 LAB — TSH: TSH: 4.3 u[IU]/mL (ref 0.35–4.50)

## 2020-04-09 NOTE — Telephone Encounter (Signed)
Please advise 

## 2020-04-09 NOTE — Telephone Encounter (Signed)
Ok to order TSH.  Katina Degree. [name], MD 04/09/2020 9:30 AM

## 2020-04-09 NOTE — Telephone Encounter (Signed)
Pt called asking if Dr. [name] would place an order to get his thyroid checked. Pt thinks he may need to get an adjustment on his thryoxine prescription. Please advise.

## 2020-04-09 NOTE — Telephone Encounter (Signed)
Order placed, patient schedule appointment with lab

## 2020-04-10 ENCOUNTER — Other Ambulatory Visit: Payer: Self-pay | Admitting: *Deleted

## 2020-04-10 MED ORDER — LEVOTHYROXINE SODIUM 100 MCG PO TABS: 100.0000 ug | ORAL_TABLET | Freq: Every day | ORAL | 3 refills | Status: DC

## 2020-04-10 NOTE — Progress Notes (Signed)
Please inform patient of the following:  Thyroid level is NORMAL.  Katina Degree. [name], MD 04/10/2020 8:11 AM

## 2020-05-14 ENCOUNTER — Other Ambulatory Visit: Payer: Self-pay | Admitting: Family Medicine

## 2020-08-13 ENCOUNTER — Other Ambulatory Visit: Payer: Self-pay | Admitting: Family Medicine

## 2020-09-12 ENCOUNTER — Encounter: Payer: Self-pay | Admitting: Family Medicine

## 2020-09-12 ENCOUNTER — Other Ambulatory Visit: Payer: Self-pay

## 2020-09-12 ENCOUNTER — Ambulatory Visit: Payer: Medicare PPO | Admitting: Family Medicine

## 2020-09-12 VITALS — BP 151/77 | HR 61 | Temp 98.0°F | Ht 67.0 in | Wt 180.6 lb

## 2020-09-12 DIAGNOSIS — R5383 Other fatigue: Secondary | ICD-10-CM

## 2020-09-12 DIAGNOSIS — E785 Hyperlipidemia, unspecified: Secondary | ICD-10-CM | POA: Diagnosis not present

## 2020-09-12 DIAGNOSIS — E038 Other specified hypothyroidism: Secondary | ICD-10-CM

## 2020-09-12 DIAGNOSIS — N400 Enlarged prostate without lower urinary tract symptoms: Secondary | ICD-10-CM | POA: Diagnosis not present

## 2020-09-12 DIAGNOSIS — E063 Autoimmune thyroiditis: Secondary | ICD-10-CM

## 2020-09-12 DIAGNOSIS — R739 Hyperglycemia, unspecified: Secondary | ICD-10-CM

## 2020-09-12 LAB — CBC
HCT: 43.7 % (ref 39.0–52.0)
Hemoglobin: 14.5 g/dL (ref 13.0–17.0)
MCHC: 33.2 g/dL (ref 30.0–36.0)
MCV: 86 fl (ref 78.0–100.0)
Platelets: 263 10*3/uL (ref 150.0–400.0)
RBC: 5.09 Mil/uL (ref 4.22–5.81)
RDW: 14.1 % (ref 11.5–15.5)
WBC: 7.6 10*3/uL (ref 4.0–10.5)

## 2020-09-12 LAB — COMPREHENSIVE METABOLIC PANEL
ALT: 17 U/L (ref 0–53)
AST: 22 U/L (ref 0–37)
Albumin: 4 g/dL (ref 3.5–5.2)
Alkaline Phosphatase: 56 U/L (ref 39–117)
BUN: 12 mg/dL (ref 6–23)
CO2: 27 mEq/L (ref 19–32)
Calcium: 9.1 mg/dL (ref 8.4–10.5)
Chloride: 106 mEq/L (ref 96–112)
Creatinine, Ser: 0.85 mg/dL (ref 0.40–1.50)
GFR: 83.09 mL/min (ref >60.00)
Glucose, Bld: 116 mg/dL — ABNORMAL HIGH (ref 70–99)
Potassium: 4 mEq/L (ref 3.5–5.1)
Sodium: 137 mEq/L (ref 135–145)
Total Bilirubin: 0.6 mg/dL (ref 0.2–1.2)
Total Protein: 7.2 g/dL (ref 6.0–8.3)

## 2020-09-12 LAB — IBC + FERRITIN
Ferritin: 10.5 ng/mL — ABNORMAL LOW (ref 22.0–322.0)
Iron: 59 ug/dL (ref 42–165)
Saturation Ratios: 13.8 % — ABNORMAL LOW (ref 20.0–50.0)
TIBC: 427 ug/dL (ref 250.0–450.0)
Transferrin: 305 mg/dL (ref 212.0–360.0)

## 2020-09-12 LAB — LIPID PANEL
Cholesterol: 126 mg/dL (ref 0–200)
HDL: 51.3 mg/dL (ref >39.00)
LDL Cholesterol: 55 mg/dL (ref 0–99)
NonHDL: 74.3
Total CHOL/HDL Ratio: 2
Triglycerides: 99 mg/dL (ref 0.0–149.0)
VLDL: 19.8 mg/dL (ref 0.0–40.0)

## 2020-09-12 LAB — TSH: TSH: 4.36 u[IU]/mL (ref 0.35–5.50)

## 2020-09-12 LAB — T4, FREE: Free T4: 0.83 ng/dL (ref 0.60–1.60)

## 2020-09-12 LAB — PSA: PSA: 4.78 ng/mL — ABNORMAL HIGH (ref 0.10–4.00)

## 2020-09-12 LAB — VITAMIN D 25 HYDROXY (VIT D DEFICIENCY, FRACTURES): VITD: 41.6 ng/mL (ref 30.00–100.00)

## 2020-09-12 LAB — HEMOGLOBIN A1C: Hgb A1c MFr Bld: 6 % (ref 4.6–6.5)

## 2020-09-12 LAB — VITAMIN B12: Vitamin B-12: 594 pg/mL (ref 211–911)

## 2020-09-12 LAB — T3, FREE: T3, Free: 3.4 pg/mL (ref 2.3–4.2)

## 2020-09-12 MED ORDER — TAMSULOSIN HCL 0.4 MG PO CAPS: 0.4000 mg | ORAL_CAPSULE | Freq: Two times a day (BID) | ORAL | 0 refills | Status: DC

## 2020-09-12 NOTE — Progress Notes (Addendum)
   Gary Willis is a 80 y.o. male who presents today for an office visit.  Assessment/Plan:  New/Acute Problems: Other fatigue We will check labs today Including TSH, B12, and irons studies.  Possibly due to hypothyroidism.  Chronic Problems Addressed Today: Hyperglycemia Check A1c.  Hypothyroidism due to Hashimoto's thyroiditis Check TSH, T4, T3.  May need to increase dose of Synthroid to 112 mcg daily.  Would ideally like to get his TSH in the lower range of normal.  Could consider at some point adding on T3 if his TSH is at goal and has persistent issues with fatigue.  Dyslipidemia On Crestor 20 mg daily.  Check labs.  BPH (benign prostatic hyperplasia) On Flomax 0.4 mg daily.  He feels like the effectiveness this is worn off a little bit.  We discussed switching to alternative such as Rapaflo however he deferred for today.  We can discuss at next office visit. Check PSA today.      Subjective:  HPI:  He has issues with stamina and fatigue. If he does yardwork he has to go in and rest without notable exertion, and also experiences fatigue when golfing.  He would like to have blood work.   He is doing fine with the Crestor, but has issues with the Flomax. However, he has started having issues with having to get up to urinate at night, and started taking it once in the morning and in the evening. This worked temporarily, but he has since began re-experiencing the need for night time urination. He denies experiencing side effects with the Flomax.  His home blood pressure readings show generally 120-124 over 70-74.        Objective:  Physical Exam: BP (!) 151/77   Pulse 61   Temp 98 F (36.7 C) (Temporal)   Ht 5\' 7"  (1.702 m)   Wt 180 lb 9.6 oz (81.9 kg)   SpO2 98%   BMI 28.29 kg/m   Gen: No acute distress, resting comfortably CV: Regular rate and rhythm with no murmurs appreciated Pulm: Normal work of breathing, clear to auscultation bilaterally with no crackles,  wheezes, or rhonchi Neuro: Grossly normal, moves all extremities Psych: Normal affect and thought content      I,Jordan Kelly,acting as a scribe for , MD.,have documented all relevant documentation on the behalf of Jacquiline Doe, MD,as directed by  Jacquiline Doe, MD while in the presence of Jacquiline Doe, MD.  I, Jacquiline Doe, MD, have reviewed all documentation for this visit. The documentation on 09/12/20 for the exam, diagnosis, procedures, and orders are all accurate and complete.  09/14/20. Katina Degree, MD 09/12/2020 11:07 AM

## 2020-09-12 NOTE — Assessment & Plan Note (Signed)
Check TSH, T4, T3.  May need to increase dose of Synthroid to 112 mcg daily.  Would ideally like to get his TSH in the lower range of normal.  Could consider at some point adding on T3 if his TSH is at goal and has persistent issues with fatigue.

## 2020-09-12 NOTE — Assessment & Plan Note (Signed)
On Flomax 0.4 mg daily.  He feels like the effectiveness this is worn off a little bit.  We discussed switching to alternative such as Rapaflo however he deferred for today.  We can discuss at next office visit.

## 2020-09-12 NOTE — Assessment & Plan Note (Signed)
Check A1c. 

## 2020-09-12 NOTE — Patient Instructions (Signed)
It was very nice to see you today!  We will check we will check blood work today.  No medication changes today.  We may need to make some adjustments to your thyroid medication.  Take care, Dr [name]  PLEASE NOTE:  If you had any lab tests please let us know if you have not heard back within a few days. You may see your results on mychart before we have a chance to review them but we will give you a call once they are reviewed by [place]. If we ordered any referrals today, please let us know if you have not heard from their office within the next week.   Please try these tips to maintain a healthy lifestyle:  Eat at least 3 REAL meals and 1-2 snacks per day.  Aim for no more than 5 hours between eating.  If you eat breakfast, please do so within one hour of getting up.   Each meal should contain half fruits/vegetables, one quarter protein, and one quarter carbs (no bigger than a computer mouse)  Cut down on sweet beverages. This includes juice, soda, and sweet tea.   Drink at least 1 glass of water with each meal and aim for at least 8 glasses per day  Exercise at least 150 minutes every week.

## 2020-09-12 NOTE — Assessment & Plan Note (Signed)
On Crestor 20 mg daily.  Check labs. 

## 2020-09-14 NOTE — Progress Notes (Signed)
Please inform patient of the following:  His thyroid is on the upper range. Recommend increasing his synthroid to daily and having him come back in 4-6 weeks to recheck his TSH. I think this is causing most of his issues.  His iron "stores" are borderline low but not to the point where he is anemic or where it would cause him any issues. It still would be a good idea for him to take ferrous sulfate 65mg  every other day and we can recheck his iron again in a few months.   His PSA is borderline elevated. This is probably due to the enlarged prostate. We can recheck again in 3-6 months or we can refer him to see a urologist if he prefers that.  Everything else is NORMAL.  . Katina Degree, MD 09/14/2020 11:24 AM

## 2020-09-25 ENCOUNTER — Telehealth: Payer: Self-pay

## 2020-09-25 NOTE — Telephone Encounter (Signed)
Pt returned a call regarding labs.

## 2020-09-26 ENCOUNTER — Other Ambulatory Visit: Payer: Self-pay | Admitting: *Deleted

## 2020-09-26 DIAGNOSIS — E063 Autoimmune thyroiditis: Secondary | ICD-10-CM

## 2020-09-26 DIAGNOSIS — E038 Other specified hypothyroidism: Secondary | ICD-10-CM

## 2020-09-26 MED ORDER — LEVOTHYROXINE SODIUM 112 MCG PO TABS: 112.0000 ug | ORAL_TABLET | Freq: Every day | ORAL | 0 refills | Status: DC

## 2020-09-26 NOTE — Telephone Encounter (Signed)
See lab notes

## 2020-10-16 DIAGNOSIS — H2512 Age-related nuclear cataract, left eye: Secondary | ICD-10-CM | POA: Diagnosis not present

## 2020-10-16 DIAGNOSIS — H524 Presbyopia: Secondary | ICD-10-CM | POA: Diagnosis not present

## 2020-11-15 ENCOUNTER — Other Ambulatory Visit: Payer: Medicare PPO

## 2020-11-15 ENCOUNTER — Other Ambulatory Visit: Payer: Self-pay

## 2020-11-15 DIAGNOSIS — E063 Autoimmune thyroiditis: Secondary | ICD-10-CM

## 2020-11-15 DIAGNOSIS — E038 Other specified hypothyroidism: Secondary | ICD-10-CM

## 2020-11-16 LAB — IRON,TIBC AND FERRITIN PANEL
%SAT: 18 % (calc) — ABNORMAL LOW (ref 20–48)
Ferritin: 19 ng/mL — ABNORMAL LOW (ref 24–380)
Iron: 62 ug/dL (ref 50–180)
TIBC: 349 mcg/dL (calc) (ref 250–425)

## 2020-11-17 ENCOUNTER — Other Ambulatory Visit: Payer: Self-pay | Admitting: Family Medicine

## 2020-11-20 ENCOUNTER — Other Ambulatory Visit: Payer: Self-pay | Admitting: Family Medicine

## 2020-11-22 NOTE — Progress Notes (Signed)
Please inform patient of the following:  His iron is low but better. He should continue his iron supplementation.  I still have not received results from the other two tests - can we check with the lab to see where they are in the process?

## 2020-11-23 ENCOUNTER — Telehealth: Payer: Self-pay

## 2020-11-23 NOTE — Telephone Encounter (Signed)
Pt called wanting to know which over the counter medicine Dr [name] wants him to take for his iron. Gary Willis would like a call back. Please Advise.

## 2020-11-26 NOTE — Telephone Encounter (Signed)
Spoke with patient  Stated got iron slow realest  Will recheck in a few month

## 2020-12-20 ENCOUNTER — Telehealth: Payer: Self-pay | Admitting: Family Medicine

## 2020-12-20 NOTE — Telephone Encounter (Signed)
Copied from CRM [id number]. Topic: Medicare AWV >> Dec 20, 2020 10:00 AM Harris-Coley, [place] Gully wrote: Reason for CRM: LVM 12/20/20 @10 :41am that AWV appt was changed from 01/10/21 to 01/23/20 due to [facility] out of office khc Please confirm appt change date.

## 2021-01-10 ENCOUNTER — Ambulatory Visit: Payer: Medicare PPO

## 2021-01-16 ENCOUNTER — Other Ambulatory Visit: Payer: Self-pay | Admitting: Family Medicine

## 2021-01-24 ENCOUNTER — Other Ambulatory Visit: Payer: Self-pay

## 2021-01-24 ENCOUNTER — Ambulatory Visit (INDEPENDENT_AMBULATORY_CARE_PROVIDER_SITE_OTHER): Payer: Medicare PPO

## 2021-01-24 VITALS — BP 122/76 | HR 67 | Temp 98.7°F | Wt 180.8 lb

## 2021-01-24 DIAGNOSIS — Z0000 Encounter for general adult medical examination without abnormal findings: Secondary | ICD-10-CM | POA: Diagnosis not present

## 2021-01-24 NOTE — Progress Notes (Addendum)
Virtual Visit via Telephone Note  I connected with  Gary Willis on 01/24/21 at  9:30 AM EST by telephone and verified that I am speaking with the correct person using two identifiers.  Medicare Annual Wellness visit completed telephonically due to Covid-19 pandemic.   Persons participating in this call: This Health Coach and this patient.   Location: Patient: Home Provider: Office   I discussed the limitations, risks, security and privacy concerns of performing an evaluation and management service by telephone and the availability of in person appointments. The patient expressed understanding and agreed to proceed.  Unable to perform video visit due to video visit attempted and failed and/or patient does not have video capability.   Some vital signs may be absent or patient reported.   Willette Brace, LPN   Subjective:   Gary Willis is a 81 y.o. male who presents for Medicare Annual/Subsequent preventive examination.  Review of Systems     Cardiac Risk Factors include: advanced age (>54men, >79 women);dyslipidemia;male gender     Objective:    Today's Vitals   01/24/21 0928  BP: 122/76  Pulse: 67  Temp: 98.7 F (37.1 C)  SpO2: 93%  Weight: 180 lb 12.8 oz (82 kg)   Body mass index is 28.32 kg/m.  Advanced Directives 01/24/2021 01/02/2020 11/23/2018  Does Patient Have a Medical Advance Directive? Yes Yes No  Type of Advance Directive - Living will -  Does patient want to make changes to medical advance directive? Yes (MAU/Ambulatory/Procedural Areas - Information given) - -  Would patient like information on creating a medical advance directive? - - Yes (MAU/Ambulatory/Procedural Areas - Information given)    Current Medications (verified) Outpatient Encounter Medications as of 01/24/2021  Medication Sig   Ascorbic Acid (VITAMIN C PO) Take by mouth.   cholecalciferol (VITAMIN D3) 25 MCG (1000 UNIT) tablet Take 1,000 Units by mouth daily.   co-enzyme Q-10  30 MG capsule Take 30 mg by mouth every morning.   glucosamine-chondroitin 500-400 MG tablet Take 1 tablet by mouth 2 (two) times daily.   ibuprofen (ADVIL) 100 MG/5ML suspension Take 200 mg by mouth every 4 (four) hours as needed.   levothyroxine (SYNTHROID) 112 MCG tablet TAKE 1 TABLET BY MOUTH ONCE DAILY BEFORE BREAKFAST   Multiple Vitamins-Minerals (MULTIVITAMIN WITH MINERALS) tablet Take 1 tablet by mouth every morning.   rosuvastatin (CRESTOR) 20 MG tablet Take 1 tablet by mouth once daily   tamsulosin (FLOMAX) 0.4 MG CAPS capsule Take 1 capsule by mouth twice daily   TURMERIC PO Take by mouth.   [DISCONTINUED] Sod Fluoride-Potassium Nitrate 1.1-5 % PSTE  (Patient not taking: Reported on 01/24/2021)   No facility-administered encounter medications on file as of 01/24/2021.    Allergies (verified) Patient has no known allergies.   History: Past Medical History:  Diagnosis Date   Arthritis    Hx of cardiovascular stress test    ETT-Myoview (05/2013):  diaph atten, no ischemia, EF 63%; Low Risk   Hyperlipidemia    Migraines    Past Surgical History:  Procedure Laterality Date   CATARACT EXTRACTION  04/2018   EYE SURGERY     TREATMENT FISTULA ANAL     Family History  Problem Relation Age of Onset   Lung cancer Mother    Heart attack Father    High Cholesterol Father    Diabetes Sister    Colon cancer Neg Hx    Social History   Socioeconomic History   Marital status:  Divorced    Spouse name: Not on file   Number of children: Not on file   Years of education: Not on file   Highest education level: Not on file  Occupational History   Occupation: retired  Tobacco Use   Smoking status: Never   Smokeless tobacco: Former    Types: Chew  Substance and Sexual Activity   Alcohol use: Yes    Alcohol/week: 1.0 standard drink    Types: 1 Cans of beer per week    Comment: social   Drug use: No   Sexual activity: Not on file  Other Topics Concern   Not on file  Social  History Narrative   Not on file   Social Determinants of Health   Financial Resource Strain: Low Risk    Difficulty of Paying Living Expenses: Not hard at all  Food Insecurity: No Food Insecurity   Worried About Charity fundraiser in the Last Year: Never true   [place] in the Last Year: Never true  Transportation Needs: No Transportation Needs   Lack of Transportation (Medical): No   Lack of Transportation (Non-Medical): No  Physical Activity: Sufficiently Active   Days of Exercise per Week: 5 days   Minutes of Exercise per Session: 60 min  Stress: No Stress Concern Present   Feeling of Stress : Not at all  Social Connections: Moderately Isolated   Frequency of Communication with Friends and Family: More than three times a week   Frequency of Social Gatherings with Friends and Family: More than three times a week   Attends Religious Services: Never   Marine scientist or Organizations: Yes   Attends Archivist Meetings: 1 to 4 times per year   Marital Status: Divorced    Tobacco Counseling Counseling given: Not Answered   Clinical Intake:  Pre-visit preparation completed: Yes  Pain : No/denies pain     BMI - recorded: 28.32 Nutritional Status: BMI 25 -29 Overweight Nutritional Risks: Other (Comment) Diabetes: No  How often do you need to have someone help you when you read instructions, pamphlets, or other written materials from your doctor or pharmacy?: 1 - Never  Diabetic?no  Interpreter Needed?: No  Information entered by :: Charlott Rakes, LPN   Activities of Daily Living In your present state of health, do you have any difficulty performing the following activities: 01/24/2021  Hearing? Y  Comment slight loss  Vision? N  Difficulty concentrating or making decisions? N  Walking or climbing stairs? N  Dressing or bathing? N  Doing errands, shopping? N  Preparing Food and eating ? N  Using the Toilet? N  In the past six months,  have you accidently leaked urine? N  Do you have problems with loss of bowel control? N  Managing your Medications? N  Managing your Finances? N  Housekeeping or managing your Housekeeping? N  Some recent data might be hidden    Patient Care Team: Vivi Barrack, MD as PCP - General (Family Medicine) Luberta Mutter, MD as Consulting Physician (Ophthalmology)  Indicate any recent Medical Services you may have received from other than [facility] providers in the past year (date may be approximate).     Assessment:   This is a routine wellness examination for [place].  Hearing/Vision screen Hearing Screening - Comments:: Pt stated slight loss  Vision Screening - Comments:: Pt follows up with Dr Ellie Lunch for annual eye exams   Dietary issues and exercise activities discussed:  Current Exercise Habits: Home exercise routine, Type of exercise: Other - see comments;walking (silver sneakers), Time (Minutes): 60, Frequency (Times/Week): 5, Weekly Exercise (Minutes/Week): 300   Goals Addressed             This Visit's Progress    Patient Stated       Back with silver sneak        Depression Screen PHQ 2/9 Scores 01/24/2021 09/12/2020 01/02/2020 12/14/2019 11/23/2018 11/23/2018 12/14/2017  PHQ - 2 Score 0 0 0 0 0 0 0    Fall Risk Fall Risk  01/24/2021 01/02/2020 11/23/2018 11/23/2018  Falls in the past year? 0 0 0 0  Number falls in past yr: 0 0 - -  Injury with Fall? 0 0 0 -  Risk for fall due to : Impaired vision Impaired vision - -  Follow up Falls prevention discussed Falls prevention discussed Falls evaluation completed;Education provided;Falls prevention discussed -    FALL RISK PREVENTION PERTAINING TO THE HOME:  Any stairs in or around the home? No  If so, are there any without handrails? No  Home free of loose throw rugs in walkways, pet beds, electrical cords, etc? Yes  Adequate lighting in your home to reduce risk of falls? Yes   ASSISTIVE DEVICES UTILIZED TO PREVENT  FALLS:  Life alert? No  Use of a cane, walker or w/c? No  Grab bars in the bathroom? No  Shower chair or bench in shower? No  Elevated toilet seat or a handicapped toilet? No   TIMED UP AND GO:  Was the test performed? Yes .  Length of time to ambulate 10 feet: 10 sec.   Gait steady and fast without use of assistive device  Cognitive Function:     6CIT Screen 01/24/2021 11/23/2018  What Year? 0 points 0 points  What month? 0 points 0 points  What time? 0 points 0 points  Count back from 20 0 points 0 points  Months in reverse 0 points 0 points  Repeat phrase 0 points 0 points  Total Score 0 0    Immunizations Immunization History  Administered Date(s) Administered   Fluad Quad(high Dose 65+) 09/14/2018, 10/14/2019   Influenza-Unspecified 10/13/2011, 11/08/2013, 12/06/2014, 10/02/2015, 11/27/2016, 09/24/2017, 10/14/2017   PFIZER(Purple Top)SARS-COV-2 Vaccination 02/18/2019, 03/15/2019   Pneumococcal Conjugate-13 06/15/2013   Pneumococcal Polysaccharide-23 10/06/2008   Td 10/13/1997, 10/06/2008, 07/22/2019   Zoster Recombinat (Shingrix) 07/22/2019, 12/31/2019   Zoster, Live 09/09/2010    TDAP status: Up to date  Flu Vaccine status: Up to date  Pneumococcal vaccine status: Up to date  Covid-19 vaccine status: Completed vaccines  Qualifies for Shingles Vaccine? Yes   Zostavax completed Yes   Shingrix Completed?: Yes  Screening Tests Health Maintenance  Topic Date Due   Hepatitis C Screening  Never done   COVID-19 Vaccine (3 - Booster for Pfizer series) 05/10/2019   INFLUENZA VACCINE  08/13/2020   TETANUS/TDAP  07/21/2029   Pneumonia Vaccine 44+ Years old  Completed   Zoster Vaccines- Shingrix  Completed   HPV VACCINES  Aged Out    Health Maintenance  Health Maintenance Due  Topic Date Due   Hepatitis C Screening  Never done   COVID-19 Vaccine (3 - Booster for Pfizer series) 05/10/2019   INFLUENZA VACCINE  08/13/2020    Colorectal cancer screening:  No longer required.    Additional Screening:  Hepatitis C Screening: does qualify;  Vision Screening: Recommended annual ophthalmology exams for early detection of glaucoma and other disorders of the  eye. Is the patient up to date with their annual eye exam?  Yes  Who is the provider or what is the name of the office in which the patient attends annual eye exams? Dr Ellie Lunch  If pt is not established with a provider, would they like to be referred to a provider to establish care? No .   Dental Screening: Recommended annual dental exams for proper oral hygiene  Community Resource Referral / Chronic Care Management: CRR required this visit?  No   CCM required this visit?  No      Plan:     I have personally reviewed and noted the following in the patients chart:   Medical and social history Use of alcohol, tobacco or illicit drugs  Current medications and supplements including opioid prescriptions. Patient is not currently taking opioid prescriptions. Functional ability and status Nutritional status Physical activity Advanced directives List of other physicians Hospitalizations, surgeries, and ER visits in previous 12 months Vitals Screenings to include cognitive, depression, and falls Referrals and appointments  In addition, I have reviewed and discussed with patient certain preventive protocols, quality metrics, and best practice recommendations. A written personalized care plan for preventive services as well as general preventive health recommendations were provided to patient.     Willette Brace, LPN   [id number]   Nurse Notes: none

## 2021-01-24 NOTE — Patient Instructions (Signed)
Gary Willis , Thank you for taking time to come for your Medicare Wellness Visit. I appreciate your ongoing commitment to your health goals. Please review the following plan we discussed and let me know if I can assist you in the future.   Screening recommendations/referrals: Colonoscopy: no longer required  Recommended yearly ophthalmology/optometry visit for glaucoma screening and checkup Recommended yearly dental visit for hygiene and checkup  Vaccinations: Influenza vaccine: Done 11/21/20 repeat every year  Pneumococcal vaccine: Up to date Tdap vaccine: Done 07/22/19 repeat every 10 years  Shingles vaccine: Completed 7/9 & 12/31/19    Covid-19: Completed 2/5, 03/15/19   Advanced directives: Please bring a copy of your health care power of attorney and living will to the office at your convenience.  Conditions/risks identified: keep up with silver sneakers   Next appointment: Follow up in one year for your annual wellness visit.   Preventive Care 80 Years and Older, Male Preventive care refers to lifestyle choices and visits with your health care provider that can promote health and wellness. What does preventive care include? A yearly physical exam. This is also called an annual well check. Dental exams once or twice a year. Routine eye exams. Ask your health care provider how often you should have your eyes checked. Personal lifestyle choices, including: Daily care of your teeth and gums. Regular physical activity. Eating a healthy diet. Avoiding tobacco and drug use. Limiting alcohol use. Practicing safe sex. Taking low doses of aspirin every day. Taking vitamin and mineral supplements as recommended by your health care provider. What happens during an annual well check? The services and screenings done by your health care provider during your annual well check will depend on your age, overall health, lifestyle risk factors, and family history of disease. Counseling  Your  health care provider may ask you questions about your: Alcohol use. Tobacco use. Drug use. Emotional well-being. Home and relationship well-being. Sexual activity. Eating habits. History of falls. Memory and ability to understand (cognition). Work and work Statistician. Screening  You may have the following tests or measurements: Height, weight, and BMI. Blood pressure. Lipid and cholesterol levels. These may be checked every 5 years, or more frequently if you are over 48 years old. Skin check. Lung cancer screening. You may have this screening every year starting at age 91 if you have a 30-pack-year history of smoking and currently smoke or have quit within the past 15 years. Fecal occult blood test (FOBT) of the stool. You may have this test every year starting at age 29. Flexible sigmoidoscopy or colonoscopy. You may have a sigmoidoscopy every 5 years or a colonoscopy every 10 years starting at age 84. Prostate cancer screening. Recommendations will vary depending on your family history and other risks. Hepatitis C blood test. Hepatitis B blood test. Sexually transmitted disease (STD) testing. Diabetes screening. This is done by checking your blood sugar (glucose) after you have not eaten for a while (fasting). You may have this done every 1-3 years. Abdominal aortic aneurysm (AAA) screening. You may need this if you are a current or former smoker. Osteoporosis. You may be screened starting at age 75 if you are at high risk. Talk with your health care provider about your test results, treatment options, and if necessary, the need for more tests. Vaccines  Your health care provider may recommend certain vaccines, such as: Influenza vaccine. This is recommended every year. Tetanus, diphtheria, and acellular pertussis (Tdap, Td) vaccine. You may need a Td booster  every 10 years. Zoster vaccine. You may need this after age 80. Pneumococcal 13-valent conjugate (PCV13) vaccine. One dose  is recommended after age 65. Pneumococcal polysaccharide (PPSV23) vaccine. One dose is recommended after age 27. Talk to your health care provider about which screenings and vaccines you need and how often you need them. This information is not intended to replace advice given to you by your health care provider. Make sure you discuss any questions you have with your health care provider. Document Released: 01/26/2015 Document Revised: 09/19/2015 Document Reviewed: 10/31/2014 Elsevier Interactive Patient Education  2017 [employer] Prevention in the Home Falls can cause injuries. They can happen to people of all ages. There are many things you can do to make your home safe and to help prevent falls. What can I do on the outside of my home? Regularly fix the edges of walkways and driveways and fix any cracks. Remove anything that might make you trip as you walk through a door, such as a raised step or threshold. Trim any bushes or trees on the path to your home. Use bright outdoor lighting. Clear any walking paths of anything that might make someone trip, such as rocks or tools. Regularly check to see if handrails are loose or broken. Make sure that both sides of any steps have handrails. Any raised decks and porches should have guardrails on the edges. Have any leaves, snow, or ice cleared regularly. Use sand or salt on walking paths during winter. Clean up any spills in your garage right away. This includes oil or grease spills. What can I do in the bathroom? Use night lights. Install grab bars by the toilet and in the tub and shower. Do not use towel bars as grab bars. Use non-skid mats or decals in the tub or shower. If you need to sit down in the shower, use a plastic, non-slip stool. Keep the floor dry. Clean up any water that spills on the floor as soon as it happens. Remove soap buildup in the tub or shower regularly. Attach bath mats securely with double-sided non-slip rug  tape. Do not have throw rugs and other things on the floor that can make you trip. What can I do in the bedroom? Use night lights. Make sure that you have a light by your bed that is easy to reach. Do not use any sheets or blankets that are too big for your bed. They should not hang down onto the floor. Have a firm chair that has side arms. You can use this for support while you get dressed. Do not have throw rugs and other things on the floor that can make you trip. What can I do in the kitchen? Clean up any spills right away. Avoid walking on wet floors. Keep items that you use a lot in easy-to-reach places. If you need to reach something above you, use a strong step stool that has a grab bar. Keep electrical cords out of the way. Do not use floor polish or wax that makes floors slippery. If you must use wax, use non-skid floor wax. Do not have throw rugs and other things on the floor that can make you trip. What can I do with my stairs? Do not leave any items on the stairs. Make sure that there are handrails on both sides of the stairs and use them. Fix handrails that are broken or loose. Make sure that handrails are as long as the stairways. Check any carpeting to  make sure that it is firmly attached to the stairs. Fix any carpet that is loose or worn. Avoid having throw rugs at the top or bottom of the stairs. If you do have throw rugs, attach them to the floor with carpet tape. Make sure that you have a light switch at the top of the stairs and the bottom of the stairs. If you do not have them, ask someone to add them for you. What else can I do to help prevent falls? Wear shoes that: Do not have high heels. Have rubber bottoms. Are comfortable and fit you well. Are closed at the toe. Do not wear sandals. If you use a stepladder: Make sure that it is fully opened. Do not climb a closed stepladder. Make sure that both sides of the stepladder are locked into place. Ask someone to  hold it for you, if possible. Clearly mark and make sure that you can see: Any grab bars or handrails. First and last steps. Where the edge of each step is. Use tools that help you move around (mobility aids) if they are needed. These include: Canes. Walkers. Scooters. Crutches. Turn on the lights when you go into a dark area. Replace any light bulbs as soon as they burn out. Set up your furniture so you have a clear path. Avoid moving your furniture around. If any of your floors are uneven, fix them. If there are any pets around you, be aware of where they are. Review your medicines with your doctor. Some medicines can make you feel dizzy. This can increase your chance of falling. Ask your doctor what other things that you can do to help prevent falls. This information is not intended to replace advice given to you by your health care provider. Make sure you discuss any questions you have with your health care provider. Document Released: 10/26/2008 Document Revised: 06/07/2015 Document Reviewed: 02/03/2014 Elsevier Interactive Patient Education  2017 [employer].

## 2021-01-28 NOTE — Progress Notes (Signed)
Gary Schleinina H Stancil Deisher, LPN   Subjective:   Gary Willis is a 81 y.o. male who presents for Medicare Annual/Subsequent preventive examination.  Review of Systems     Cardiac Risk Factors include: advanced age (>2855men, 67>65 women);dyslipidemia;male gender     Objective:    Today's Vitals   01/24/21 0928  BP: 122/76  Pulse: 67  Temp: 98.7 F (37.1 C)  SpO2: 93%  Weight: 180 lb 12.8 oz (82 kg)   Body mass index is 28.32 kg/m.  Advanced Directives 01/24/2021 01/02/2020 11/23/2018  Does Patient Have a Medical Advance Directive? Yes Yes No  Type of Advance Directive - Living will -  Does patient want to make changes to medical advance directive? Yes (MAU/Ambulatory/Procedural Areas - Information given) - -  Would patient like information on creating a medical advance directive? - - Yes (MAU/Ambulatory/Procedural Areas - Information given)    Current Medications (verified) Outpatient Encounter Medications as of 01/24/2021  Medication Sig   Ascorbic Acid (VITAMIN C PO) Take by mouth.   cholecalciferol (VITAMIN D3) 25 MCG (1000 UNIT) tablet Take 1,000 Units by mouth daily.   co-enzyme Q-10 30 MG capsule Take 30 mg by mouth every morning.   glucosamine-chondroitin 500-400 MG tablet Take 1 tablet by mouth 2 (two) times daily.   ibuprofen (ADVIL) 100 MG/5ML suspension Take 200 mg by mouth every 4 (four) hours as needed.   levothyroxine (SYNTHROID) 112 MCG tablet TAKE 1 TABLET BY MOUTH ONCE DAILY BEFORE BREAKFAST   Multiple Vitamins-Minerals (MULTIVITAMIN WITH MINERALS) tablet Take 1 tablet by mouth every morning.   rosuvastatin (CRESTOR) 20 MG tablet Take 1 tablet by mouth once daily   tamsulosin (FLOMAX) 0.4 MG CAPS capsule Take 1 capsule by mouth twice daily   TURMERIC PO Take by mouth.   [DISCONTINUED] Sod Fluoride-Potassium Nitrate 1.1-5 % PSTE  (Patient not taking: Reported on 01/24/2021)   No facility-administered encounter medications on file as of 01/24/2021.    Allergies  (verified) Patient has no known allergies.   History: Past Medical History:  Diagnosis Date   Arthritis    Hx of cardiovascular stress test    ETT-Myoview (05/2013):  diaph atten, no ischemia, EF 63%; Low Risk   Hyperlipidemia    Migraines    Past Surgical History:  Procedure Laterality Date   CATARACT EXTRACTION  04/2018   EYE SURGERY     TREATMENT FISTULA ANAL     Family History  Problem Relation Age of Onset   Lung cancer Mother    Heart attack Father    High Cholesterol Father    Diabetes Sister    Colon cancer Neg Hx    Social History   Socioeconomic History   Marital status: Divorced    Spouse name: Not on file   Number of children: Not on file   Years of education: Not on file   Highest education level: Not on file  Occupational History   Occupation: retired  Tobacco Use   Smoking status: Never   Smokeless tobacco: Former    Types: Chew  Substance and Sexual Activity   Alcohol use: Yes    Alcohol/week: 1.0 standard drink    Types: 1 Cans of beer per week    Comment: social   Drug use: No   Sexual activity: Not on file  Other Topics Concern   Not on file  Social History Narrative   Not on file   Social Determinants of Health   Financial Resource Strain: Low Risk  Difficulty of Paying Living Expenses: Not hard at all  Food Insecurity: No Food Insecurity   Worried About Running Out of Food in the Last Year: Never true   Ran Out of Food in the Last Year: Never true  Transportation Needs: No Transportation Needs   Lack of Transportation (Medical): No   Lack of Transportation (Non-Medical): No  Physical Activity: Sufficiently Active   Days of Exercise per Week: 5 days   Minutes of Exercise per Session: 60 min  Stress: No Stress Concern Present   Feeling of Stress : Not at all  Social Connections: Moderately Isolated   Frequency of Communication with Friends and Family: More than three times a week   Frequency of Social Gatherings with Friends and  Family: More than three times a week   Attends Religious Services: Never   Database administratorActive Member of Clubs or Organizations: Yes   Attends BankerClub or Organization Meetings: 1 to 4 times per year   Marital Status: Divorced    Tobacco Counseling Counseling given: Not Answered   Clinical Intake:  Pre-visit preparation completed: Yes  Pain : No/denies pain     BMI - recorded: 28.32 Nutritional Status: BMI 25 -29 Overweight Nutritional Risks: Other (Comment) Diabetes: No  How often do you need to have someone help you when you read instructions, pamphlets, or other written materials from your doctor or pharmacy?: 1 - Never  Diabetic?no  Interpreter Needed?: No  Information entered by :: Lanier Ensignina Romolo Sieling, LPN   Activities of Daily Living In your present state of health, do you have any difficulty performing the following activities: 01/24/2021  Hearing? Y  Comment slight loss  Vision? N  Difficulty concentrating or making decisions? N  Walking or climbing stairs? N  Dressing or bathing? N  Doing errands, shopping? N  Preparing Food and eating ? N  Using the Toilet? N  In the past six months, have you accidently leaked urine? N  Do you have problems with loss of bowel control? N  Managing your Medications? N  Managing your Finances? N  Housekeeping or managing your Housekeeping? N  Some recent data might be hidden    Patient Care Team: Ardith DarkParker, Caleb M, MD as PCP - General (Family Medicine) Maris BergerMcCuen, Christine, MD as Consulting Physician (Ophthalmology)  Indicate any recent Medical Services you may have received from other than [facility] providers in the past year (date may be approximate).     Assessment:   This is a routine wellness examination for Gary Willis.  Hearing/Vision screen Hearing Screening - Comments:: Pt stated slight loss  Vision Screening - Comments:: Pt follows up with Dr Charlotte SanesMcCuen for annual eye exams   Dietary issues and exercise activities discussed: Current Exercise  Habits: Home exercise routine, Type of exercise: Other - see comments;walking (silver sneakers), Time (Minutes): 60, Frequency (Times/Week): 5, Weekly Exercise (Minutes/Week): 300   Goals Addressed             This Visit's Progress    Patient Stated       Back with silver sneak       Depression Screen PHQ 2/9 Scores 01/24/2021 09/12/2020 01/02/2020 12/14/2019 11/23/2018 11/23/2018 12/14/2017  PHQ - 2 Score 0 0 0 0 0 0 0    Fall Risk Fall Risk  01/24/2021 01/02/2020 11/23/2018 11/23/2018  Falls in the past year? 0 0 0 0  Number falls in past yr: 0 0 - -  Injury with Fall? 0 0 0 -  Risk for fall due to :  Impaired vision Impaired vision - -  Follow up Falls prevention discussed Falls prevention discussed Falls evaluation completed;Education provided;Falls prevention discussed -    FALL RISK PREVENTION PERTAINING TO THE HOME:  Any stairs in or around the home? No  If so, are there any without handrails? No  Home free of loose throw rugs in walkways, pet beds, electrical cords, etc? Yes  Adequate lighting in your home to reduce risk of falls? Yes   ASSISTIVE DEVICES UTILIZED TO PREVENT FALLS:  Life alert? No  Use of a cane, walker or w/c? No  Grab bars in the bathroom? No  Shower chair or bench in shower? No  Elevated toilet seat or a handicapped toilet? No   TIMED UP AND GO:  Was the test performed? Yes .  Length of time to ambulate 10 feet: 10 sec.   Gait steady and fast without use of assistive device  Cognitive Function:     6CIT Screen 01/24/2021 11/23/2018  What Year? 0 points 0 points  What month? 0 points 0 points  What time? 0 points 0 points  Count back from 20 0 points 0 points  Months in reverse 0 points 0 points  Repeat phrase 0 points 0 points  Total Score 0 0    Immunizations Immunization History  Administered Date(s) Administered   Fluad Quad(high Dose 65+) 09/14/2018, 10/14/2019   Influenza-Unspecified 10/13/2011, 11/08/2013, 12/06/2014,  10/02/2015, 11/27/2016, 09/24/2017, 10/14/2017   PFIZER(Purple Top)SARS-COV-2 Vaccination 02/18/2019, 03/15/2019   Pneumococcal Conjugate-13 06/15/2013   Pneumococcal Polysaccharide-23 10/06/2008   Td 10/13/1997, 10/06/2008, 07/22/2019   Zoster Recombinat (Shingrix) 07/22/2019, 12/31/2019   Zoster, Live 09/09/2010    TDAP status: Up to date  Flu Vaccine status: Up to date  Pneumococcal vaccine status: Up to date  Covid-19 vaccine status: Completed vaccines  Qualifies for Shingles Vaccine? Yes   Zostavax completed Yes   Shingrix Completed?: Yes  Screening Tests Health Maintenance  Topic Date Due   Hepatitis C Screening  Never done   COVID-19 Vaccine (3 - Booster for Pfizer series) 05/10/2019   INFLUENZA VACCINE  08/13/2020   TETANUS/TDAP  07/21/2029   Pneumonia Vaccine 36+ Years old  Completed   Zoster Vaccines- Shingrix  Completed   HPV VACCINES  Aged Out    Health Maintenance  Health Maintenance Due  Topic Date Due   Hepatitis C Screening  Never done   COVID-19 Vaccine (3 - Booster for Pfizer series) 05/10/2019   INFLUENZA VACCINE  08/13/2020    Colorectal cancer screening: No longer required.    Additional Screening:  Hepatitis C Screening: does qualify;  Vision Screening: Recommended annual ophthalmology exams for early detection of glaucoma and other disorders of the eye. Is the patient up to date with their annual eye exam?  Yes  Who is the provider or what is the name of the office in which the patient attends annual eye exams? Dr Charlotte Sanes  If pt is not established with a provider, would they like to be referred to a provider to establish care? No .   Dental Screening: Recommended annual dental exams for proper oral hygiene  Community Resource Referral / Chronic Care Management: CRR required this visit?  No   CCM required this visit?  No      Plan:     I have personally reviewed and noted the following in the patients chart:   Medical and  social history Use of alcohol, tobacco or illicit drugs  Current medications and supplements including opioid prescriptions.  Patient is not currently taking opioid prescriptions. Functional ability and status Nutritional status Physical activity Advanced directives List of other physicians Hospitalizations, surgeries, and ER visits in previous 12 months Vitals Screenings to include cognitive, depression, and falls Referrals and appointments  In addition, I have reviewed and discussed with patient certain preventive protocols, quality metrics, and best practice recommendations. A written personalized care plan for preventive services as well as general preventive health recommendations were provided to patient.     Gary Schlein, LPN   3/90/3009   Nurse Notes: none

## 2021-02-07 ENCOUNTER — Encounter: Payer: Self-pay | Admitting: Family Medicine

## 2021-02-07 ENCOUNTER — Emergency Department (HOSPITAL_BASED_OUTPATIENT_CLINIC_OR_DEPARTMENT_OTHER): Payer: Medicare PPO | Admitting: Radiology

## 2021-02-07 ENCOUNTER — Other Ambulatory Visit: Payer: Self-pay

## 2021-02-07 ENCOUNTER — Ambulatory Visit (INDEPENDENT_AMBULATORY_CARE_PROVIDER_SITE_OTHER): Payer: Medicare PPO | Admitting: Family Medicine

## 2021-02-07 ENCOUNTER — Emergency Department (HOSPITAL_BASED_OUTPATIENT_CLINIC_OR_DEPARTMENT_OTHER)
Admission: EM | Admit: 2021-02-07 | Discharge: 2021-02-07 | Disposition: A | Discharge status: Home / Self Care | Payer: Medicare PPO | Attending: Emergency Medicine | Admitting: Emergency Medicine

## 2021-02-07 VITALS — BP 152/68 | HR 76 | Ht 66.0 in | Wt 185.2 lb

## 2021-02-07 DIAGNOSIS — R55 Syncope and collapse: Secondary | ICD-10-CM | POA: Insufficient documentation

## 2021-02-07 DIAGNOSIS — Z743 Need for continuous supervision: Secondary | ICD-10-CM | POA: Diagnosis not present

## 2021-02-07 DIAGNOSIS — S60944A Unspecified superficial injury of right ring finger, initial encounter: Secondary | ICD-10-CM | POA: Diagnosis present

## 2021-02-07 DIAGNOSIS — S61219S Laceration without foreign body of unspecified finger without damage to nail, sequela: Secondary | ICD-10-CM | POA: Diagnosis not present

## 2021-02-07 DIAGNOSIS — W268XXA Contact with other sharp object(s), not elsewhere classified, initial encounter: Secondary | ICD-10-CM | POA: Insufficient documentation

## 2021-02-07 DIAGNOSIS — Y93E9 Activity, other interior property and clothing maintenance: Secondary | ICD-10-CM | POA: Insufficient documentation

## 2021-02-07 DIAGNOSIS — R404 Transient alteration of awareness: Secondary | ICD-10-CM | POA: Diagnosis not present

## 2021-02-07 DIAGNOSIS — S61214A Laceration without foreign body of right ring finger without damage to nail, initial encounter: Secondary | ICD-10-CM | POA: Diagnosis not present

## 2021-02-07 DIAGNOSIS — R110 Nausea: Secondary | ICD-10-CM | POA: Diagnosis not present

## 2021-02-07 LAB — CBC WITH DIFFERENTIAL/PLATELET
Abs Immature Granulocytes: 0.06 10*3/uL (ref 0.00–0.07)
Basophils Absolute: 0.1 10*3/uL (ref 0.0–0.1)
Basophils Relative: 1 %
Eosinophils Absolute: 0.1 10*3/uL (ref 0.0–0.5)
Eosinophils Relative: 1 %
HCT: 46.3 % (ref 39.0–52.0)
Hemoglobin: 15.3 g/dL (ref 13.0–17.0)
Immature Granulocytes: 1 %
Lymphocytes Relative: 13 %
Lymphs Abs: 1.7 10*3/uL (ref 0.7–4.0)
MCH: 28.7 pg (ref 26.0–34.0)
MCHC: 33 g/dL (ref 30.0–36.0)
MCV: 86.7 fL (ref 80.0–100.0)
Monocytes Absolute: 1.4 10*3/uL — ABNORMAL HIGH (ref 0.1–1.0)
Monocytes Relative: 11 %
Neutro Abs: 9.8 10*3/uL — ABNORMAL HIGH (ref 1.7–7.7)
Neutrophils Relative %: 73 %
Platelets: 262 10*3/uL (ref 150–400)
RBC: 5.34 MIL/uL (ref 4.22–5.81)
RDW: 13.2 % (ref 11.5–15.5)
WBC: 13.1 10*3/uL — ABNORMAL HIGH (ref 4.0–10.5)
nRBC: 0 % (ref 0.0–0.2)

## 2021-02-07 LAB — COMPREHENSIVE METABOLIC PANEL
ALT: 18 U/L (ref 0–44)
AST: 22 U/L (ref 15–41)
Albumin: 4.4 g/dL (ref 3.5–5.0)
Alkaline Phosphatase: 50 U/L (ref 38–126)
Anion gap: 8 (ref 5–15)
BUN: 16 mg/dL (ref 8–23)
CO2: 27 mmol/L (ref 22–32)
Calcium: 9.5 mg/dL (ref 8.9–10.3)
Chloride: 103 mmol/L (ref 98–111)
Creatinine, Ser: 0.95 mg/dL (ref 0.61–1.24)
GFR, Estimated: >60 mL/min (ref >60)
Glucose, Bld: 111 mg/dL — ABNORMAL HIGH (ref 70–99)
Potassium: 4.2 mmol/L (ref 3.5–5.1)
Sodium: 138 mmol/L (ref 135–145)
Total Bilirubin: 0.5 mg/dL (ref 0.3–1.2)
Total Protein: 8.1 g/dL (ref 6.5–8.1)

## 2021-02-07 LAB — TROPONIN I (HIGH SENSITIVITY): Troponin I (High Sensitivity): 3 ng/L (ref <18)

## 2021-02-07 MED ORDER — SODIUM CHLORIDE 0.9 % IV BOLUS
500.0000 mL | Freq: Once | INTRAVENOUS | Status: AC
  Administered 2021-02-07: 500 mL via INTRAVENOUS

## 2021-02-07 MED ORDER — CEPHALEXIN 500 MG PO CAPS: 500.0000 mg | ORAL_CAPSULE | Freq: Two times a day (BID) | ORAL | 0 refills | Status: AC

## 2021-02-07 NOTE — ED Triage Notes (Addendum)
Pt to ED via EMS from PCP office, pt was at primary care office to get right ring finger laceration stitched, this lac occurred last night on a razor blade, Whle at PCP office, pt received lidocaine and 1 stitch and then had syncopal episode lasting only a few seconds, pt voided on self during this episode. Pt also reports 1 week ago had dental work, received lidocaine and had similar episode. Reports tetanus shot 2 years ago.

## 2021-02-07 NOTE — Progress Notes (Signed)
° °  Gary Willis is a 81 y.o. male who presents today for an office visit.  Assessment/Plan:  New/Acute Problems: Syncope Likely vasovagal though given his persistent weakness and inability to sit or stand up, and his other risk factors, patient was strongly recommend patient go to the ED for cardiac evaluation. He was agreeable. He had an EKG via EMS which was reassuring. He was taken to the ED via EMS.   Laceration Only single stitch was placed due to his syncopal episode.  He can come back here to have replaced once he is stable.  His exam was reassuring.  No signs of tendon damage.  Neurovascular intact distally.     Subjective:  HPI:  Patient here with finger injury. This happened yesterday night. Located on the ring finger at right hand. He notes he was in the bathroom trying to take off his wallpaper. He notes one of the blade accidentally cut his finger. Had increased pain. He tried putting bandage to stop the bleeding. Have some soreness present. Had lots of bleeding at that time. He denies any other injuries or pain.        Objective:  Physical Exam: BP (!) 152/68    Pulse 76    Ht 5\' 6"  (1.676 m)    Wt 185 lb 3.2 oz (84 kg)    SpO2 96%    BMI 29.89 kg/m   Gen: No acute distress, resting comfortably CV: Regular rate and rhythm with no murmurs appreciated Pulm: Normal work of breathing, clear to auscultation bilaterally with no crackles, wheezes, or rhonchi Skin: Approximately 3 cm linear laceration on dorsal aspect of right fourth digit.  Clean margins.  Able to fully extend finger.  Good distal cap refill. Neuro: Grossly normal, moves all extremities Psych: Normal affect and thought content  Procedure Note Verbal Consent was obtained.  Digital block was performed on his right fourth digit using 3 cc of 1% lidocaine without epinephrine.  The area was prepped with Betadine and draped with sterile drape.  After adequate anesthesia was obtained a single stitch was  placed.  While the stitch was being placed, patient became unresponsive.  Did not respond to sternal rub.  Had an episode of bladder and bowel incontinence during this episode.  No seizure like activity was observed. Pulse remained present. EMS was called.  Within 3 to 4 minutes, patient regained consciousness though felt very faint and weak.  Vitals remained stable and he did not have any other episodes, however he was unable to sit or stand up in our office without feeling faint and dizzy.  He was transported to the ED via EMS.        I,[name],acting as a Education administrator for [name], MD.,have documented all relevant documentation on the behalf of [name], MD,as directed by  [name], MD while in the presence of [name], MD.   I, [name], MD, have reviewed all documentation for this visit. The documentation on 02/07/21 for the exam, diagnosis, procedures, and orders are all accurate and complete.  Time Spent: >60 minutes of total time was spent on the date of the encounter performing the following actions: chart review prior to seeing the patient, obtaining history, performing a medically necessary exam, assessing patient during his syncopal episode, counseling on the treatment plan, placing orders, and documenting in our EHR.     Algis Greenhouse. Jerline Pain, MD 02/07/2021 11:00 AM

## 2021-02-07 NOTE — ED Provider Notes (Signed)
Emergency Department Provider Note   I have reviewed the triage vital signs and the nursing notes.   HISTORY  Chief Complaint Near Syncope and Finger Injury (Right ring )   HPI ONTARIO WIEBER is a 81 y.o. male with a past medical history reviewed below presents to the emergency department for evaluation of syncope during repair of his finger laceration at his PCP office today.  Patient cut his right ring finger while doing home repairs yesterday at 6 PM.  States he was persistent using for much of the evening which she ultimately got to stop by applying direct pressure.  He states he did not think of coming to the emergency department for repair but instead saw his PCP earlier this morning.  His PCP performed a digital block and began to suture the laceration when he had a syncope event.  No reported seizure activity.  He did have some urine incontinence.  He was transported to the emergency department for evaluation by EMS.  Patient denies any palpitations or chest discomfort prior to passing out.  States the procedure was painful when he has passed out when getting lidocaine in the past.    Past Medical History:  Diagnosis Date   Arthritis    Hx of cardiovascular stress test    ETT-Myoview (05/2013):  diaph atten, no ischemia, EF 63%; Low Risk   Hyperlipidemia    Migraines     Review of Systems  Constitutional: No fever/chills Eyes: No visual changes. ENT: No sore throat. Cardiovascular: Denies chest pain. Positive syncope.  Respiratory: Denies shortness of breath. Gastrointestinal: No abdominal pain.  No nausea, no vomiting.  No diarrhea.  No constipation. Genitourinary: Negative for dysuria. Musculoskeletal: Negative for back pain. Skin: Negative for rash. Positive finger laceration.  Neurological: Negative for headaches, focal weakness or numbness.  ____________________________________________   PHYSICAL EXAM:  VITAL SIGNS: ED Triage Vitals  Enc Vitals Group      BP 02/07/21 1105 133/62     Pulse Rate 02/07/21 1105 (!) 58     Resp 02/07/21 1105 16     Temp 02/07/21 1105 97.8 F (36.6 C)     Temp Source 02/07/21 1105 Oral     SpO2 02/07/21 1105 100 %     Weight 02/07/21 1105 185 lb (83.9 kg)     Height 02/07/21 1105 5\' 6"  (1.676 m)   Constitutional: Alert and oriented. Well appearing and in no acute distress. Eyes: Conjunctivae are normal. Head: Atraumatic. Nose: No congestion/rhinnorhea. Mouth/Throat: Mucous membranes are moist.  Neck: No stridor.   Cardiovascular: Normal rate, regular rhythm. Good peripheral circulation. Grossly normal heart sounds.   Respiratory: Normal respiratory effort.  No retractions. Lungs CTAB. Gastrointestinal: Soft and nontender. No distention.  Musculoskeletal: No lower extremity tenderness nor edema. No gross deformities of extremities.  Normal flexor and extensor function of the right ring finger.  Joints tested individually.  No clear extension of the laceration into the joint space.  Neurologic:  Normal speech and language. No gross focal neurologic deficits are appreciated.  Skin:  Skin is warm and dry.  There is an approximately 3 cm laceration to the dorsal aspect of the right ring finger.  Wound is hemostatic and superficial.   ____________________________________________   LABS (all labs ordered are listed, but only abnormal results are displayed)  Labs Reviewed  COMPREHENSIVE METABOLIC PANEL - Abnormal; Notable for the following components:      Result Value   Glucose, Bld 111 (*)  All other components within normal limits  CBC WITH DIFFERENTIAL/PLATELET - Abnormal; Notable for the following components:   WBC 13.1 (*)    Neutro Abs 9.8 (*)    Monocytes Absolute 1.4 (*)    All other components within normal limits  TROPONIN I (HIGH SENSITIVITY)   ____________________________________________  EKG   EKG Interpretation  Date/Time:  Thursday February 07 2021 11:15:05 EST Ventricular Rate:   63 PR Interval:  203 QRS Duration: 77 QT Interval:  397 QTC Calculation: 407 R Axis:   32 Text Interpretation: Sinus rhythm Abnormal R-wave progression, early transition Confirmed by Nanda Quinton [id number]) on 02/07/2021 11:53:02 AM        ____________________________________________  RADIOLOGY  DG Finger Ring Right  Result Date: 02/07/2021 CLINICAL DATA:  Right fourth finger laceration. EXAM: RIGHT RING FINGER 2+V COMPARISON:  None. FINDINGS: There is no evidence of acute fracture or dislocation. Mild degenerative changes seen involving the fourth proximal interphalangeal joint. Soft tissues are unremarkable. IMPRESSION: No acute abnormality seen.  No radiopaque foreign body is noted. Electronically Signed   By: Marijo Conception M.D.   On: 02/07/2021 12:26    ____________________________________________   PROCEDURES  Procedure(s) performed:   .Suture Removal  Date/Time: 02/08/2021 7:53 AM Performed by: Margette Fast, MD Authorized by: Margette Fast, MD   Consent:    Consent obtained:  Verbal   Consent given by:  Patient   Risks, benefits, and alternatives were discussed: yes     Risks discussed:  Bleeding Universal protocol:    Patient identity confirmed:  Verbally with patient Location:    Location:  Upper extremity   Upper extremity location:  Hand   Hand location:  R ring finger Procedure details:    Wound appearance:  No signs of infection   Number of sutures removed:  1 Post-procedure details:    Post-removal:  Steri-Strips applied and dressing applied   Procedure completion:  Tolerated well, no immediate complications   ____________________________________________   INITIAL IMPRESSION / ASSESSMENT AND PLAN / ED COURSE  Pertinent labs & imaging results that were available during my care of the patient were reviewed by me and considered in my medical decision making (see chart for details).   This patient is Presenting for Evaluation of syncope, which does  require a range of treatment options, and is a complaint that involves a high risk of morbidity and mortality.  The Differential Diagnoses includes cardiogenic syncope, dehydration, vasovagal syncope, SAH, ACS, arrhythmia.   Critical Interventions- IVF and EKG with wound repair and splinting.    Medications  sodium chloride 0.9 % bolus 500 mL (0 mLs Intravenous Stopped 02/07/21 1258)    Reassessment after intervention: Patient looking well without vital sign abnormality.    I did obtain Additional Historical Information from EMS who transported the patient from the PCP office.   I decided to review pertinent External Data, and in summary no ECHO in our record for review.   Clinical Laboratory Tests Ordered, included CBC showing leukocytosis but no severe anemia.  Troponin is normal.  No acute kidney injury or electrolyte disturbance.  Radiologic Tests Ordered, included x-ray of the right ring finger. I independently interpreted the images and agree with radiology interpretation.   Cardiac Monitor Tracing which shows NSR. No ectopy.    Social Determinants of Health Risk formerly used chew tobacco. No smoking history.   Medical Decision Making: Summary:  Patient presents to the emergency department for evaluation of syncope in the setting of  a painful procedure at the PCP office.  Suspect this was vasovagal.  Evaluation here for potential cardiogenic syncope is not showing evidence of this.  No ectopy or other arrhythmia on cardiac monitoring in the ED.  I removed the suture placed by the PCP office.  At this point he is more than 12 hours after the initial injury.  We discussed that primary closure with suture or glue at this time would increase the chance for infection.  I did clean the wound and flushed, which was also done at the PCP office.  I closed the wound with Steri-Strips and placed a nonadherent dressing along with a finger splint.  Advised patient to keep the splint on for the  next several days but that he is able to remove it for gentle range of motion and dressing changes.  He can follow with the PCP for additional wound care.  We will start him on antibiotics with open wound now for greater than 12 hours. No evidence of joint space violation of tendon injury.   Disposition: discharge  ____________________________________________  FINAL CLINICAL IMPRESSION(S) / ED DIAGNOSES  Final diagnoses:  Vasovagal syncope  Laceration of right ring finger without foreign body without damage to nail, initial encounter     NEW OUTPATIENT MEDICATIONS STARTED DURING THIS VISIT:  Discharge Medication List as of 02/07/2021  1:11 PM     START taking these medications   Details  cephALEXin (KEFLEX) 500 MG capsule Take 1 capsule (500 mg total) by mouth 2 (two) times daily for 7 days., Starting Thu 02/07/2021, Until Thu 02/14/2021, Normal        Note:  This document was prepared using Dragon voice recognition software and may include unintentional dictation errors.  Nanda Quinton, MD, [facility] Emergency Medicine    Ardean Melroy, [facility], MD 02/08/21 [phone]

## 2021-02-07 NOTE — Discharge Instructions (Signed)
You were seen in the emergency room today after passing out.  I suspect this was due to the procedure being performed on your finger and not related to your heart.  Your lab work is reassuring.  Your finger is not broken but I have placed you in a splint to help the wound edges heal.  I have placed Steri-Strips to hold the wound together but these will come off in the next week.  I am starting on antibiotics to prevent infection.  Please follow with your primary care doctor in the next week for wound reevaluation.

## 2021-02-07 NOTE — ED Notes (Signed)
Pt d/c home per MD order. Discharge summary reviewed, pt verbalizes understanding. Ambulatory off unit with visitor who is discharge ride home. No s/s of acute distress noted.

## 2021-02-13 ENCOUNTER — Other Ambulatory Visit: Payer: Self-pay | Admitting: Family Medicine

## 2021-02-14 ENCOUNTER — Other Ambulatory Visit: Payer: Self-pay | Admitting: *Deleted

## 2021-02-14 MED ORDER — LEVOTHYROXINE SODIUM 112 MCG PO TABS: 112.0000 ug | ORAL_TABLET | Freq: Every day | ORAL | 1 refills | Status: DC

## 2021-03-13 ENCOUNTER — Other Ambulatory Visit: Payer: Self-pay

## 2021-03-13 ENCOUNTER — Encounter: Payer: Self-pay | Admitting: Family Medicine

## 2021-03-13 ENCOUNTER — Ambulatory Visit: Payer: Medicare PPO | Admitting: Family Medicine

## 2021-03-13 VITALS — BP 154/69 | HR 68 | Temp 97.5°F | Ht 66.0 in | Wt 180.6 lb

## 2021-03-13 DIAGNOSIS — N400 Enlarged prostate without lower urinary tract symptoms: Secondary | ICD-10-CM | POA: Diagnosis not present

## 2021-03-13 DIAGNOSIS — E785 Hyperlipidemia, unspecified: Secondary | ICD-10-CM

## 2021-03-13 DIAGNOSIS — E063 Autoimmune thyroiditis: Secondary | ICD-10-CM | POA: Diagnosis not present

## 2021-03-13 DIAGNOSIS — R972 Elevated prostate specific antigen [PSA]: Secondary | ICD-10-CM | POA: Diagnosis not present

## 2021-03-13 DIAGNOSIS — E038 Other specified hypothyroidism: Secondary | ICD-10-CM | POA: Diagnosis not present

## 2021-03-13 DIAGNOSIS — E611 Iron deficiency: Secondary | ICD-10-CM

## 2021-03-13 LAB — COMPREHENSIVE METABOLIC PANEL
ALT: 18 U/L (ref 0–53)
AST: 20 U/L (ref 0–37)
Albumin: 4.1 g/dL (ref 3.5–5.2)
Alkaline Phosphatase: 53 U/L (ref 39–117)
BUN: 13 mg/dL (ref 6–23)
CO2: 28 mEq/L (ref 19–32)
Calcium: 9.1 mg/dL (ref 8.4–10.5)
Chloride: 105 mEq/L (ref 96–112)
Creatinine, Ser: 0.84 mg/dL (ref 0.40–1.50)
GFR: 83.09 mL/min (ref >60.00)
Glucose, Bld: 118 mg/dL — ABNORMAL HIGH (ref 70–99)
Potassium: 4.1 mEq/L (ref 3.5–5.1)
Sodium: 139 mEq/L (ref 135–145)
Total Bilirubin: 0.7 mg/dL (ref 0.2–1.2)
Total Protein: 7.4 g/dL (ref 6.0–8.3)

## 2021-03-13 LAB — IBC + FERRITIN
Ferritin: 41.2 ng/mL (ref 22.0–322.0)
Iron: 36 ug/dL — ABNORMAL LOW (ref 42–165)
Saturation Ratios: 10.2 % — ABNORMAL LOW (ref 20.0–50.0)
TIBC: 351.4 ug/dL (ref 250.0–450.0)
Transferrin: 251 mg/dL (ref 212.0–360.0)

## 2021-03-13 LAB — CBC
HCT: 43.1 % (ref 39.0–52.0)
Hemoglobin: 14.5 g/dL (ref 13.0–17.0)
MCHC: 33.6 g/dL (ref 30.0–36.0)
MCV: 87.4 fl (ref 78.0–100.0)
Platelets: 233 10*3/uL (ref 150.0–400.0)
RBC: 4.93 Mil/uL (ref 4.22–5.81)
RDW: 14 % (ref 11.5–15.5)
WBC: 10.4 10*3/uL (ref 4.0–10.5)

## 2021-03-13 LAB — TSH: TSH: 2.18 u[IU]/mL (ref 0.35–5.50)

## 2021-03-13 LAB — T3, FREE: T3, Free: 3 pg/mL (ref 2.3–4.2)

## 2021-03-13 LAB — PSA: PSA: 4.86 ng/mL — ABNORMAL HIGH (ref 0.10–4.00)

## 2021-03-13 LAB — T4, FREE: Free T4: 0.91 ng/dL (ref 0.60–1.60)

## 2021-03-13 NOTE — Assessment & Plan Note (Signed)
Check thyroid labs.  He is on Synthroid 112 mcg daily.  Would ideally like to get his TSH in the lower range of normal.  We will adjust dose of Synthroid as needed.  We could consider adding T3 if TSH is in range and continues have issues with persistent fatigue. ?

## 2021-03-13 NOTE — Assessment & Plan Note (Signed)
He is on iron supplementation. Will check iron panel today.  ?

## 2021-03-13 NOTE — Patient Instructions (Signed)
It was very nice to see you today! ? ?We will check blood work today. ? ?We will send in new medications once we have your blood work back. ? ?Take care, ?Dr [name] ? ?PLEASE NOTE: ? ?If you had any lab tests please let us know if you have not heard back within a few days. You may see your results on mychart before we have a chance to review them but we will give you a call once they are reviewed by [place]. If we ordered any referrals today, please let us know if you have not heard from their office within the next week.  ? ?Please try these tips to maintain a healthy lifestyle: ? ?Eat at least 3 REAL meals and 1-2 snacks per day.  Aim for no more than 5 hours between eating.  If you eat breakfast, please do so within one hour of getting up.  ? ?Each meal should contain half fruits/vegetables, one quarter protein, and one quarter carbs (no bigger than a computer mouse) ? ?Cut down on sweet beverages. This includes juice, soda, and sweet tea.  ? ?Drink at least 1 glass of water with each meal and aim for at least 8 glasses per day ? ?Exercise at least 150 minutes every week.   ?

## 2021-03-13 NOTE — Progress Notes (Signed)
? ?  Gary Willis is a 81 y.o. male who presents today for an office visit. ? ?Assessment/Plan:  ?New/Acute Problems: ?Elevated PSA ?Recheck PSA today ? ?Chronic Problems Addressed Today: ?Hypothyroidism due to Hashimoto's thyroiditis ?Check thyroid labs.  He is on Synthroid 112 mcg daily.  Would ideally like to get his TSH in the lower range of normal.  We will adjust dose of Synthroid as needed.  We could consider adding T3 if TSH is in range and continues have issues with persistent fatigue. ? ?Dyslipidemia ?On crestor 20mg  daily. Tolerating well.  ? ?BPH (benign prostatic hyperplasia) ?On Flomax 0.4 mg daily.  We will check PSA. ? ?Iron deficiency ?He is on iron supplementation. Will check iron panel today.  ? ? ?  ?Subjective:  ?HPI: ? ?Patient here with concerns about thyroid levels.  He has noticed over the last 4 to 6 weeks increasing lethargy and fatigue.  Also increased appetite.  He has noticed some bowel changes as well.  Did not have blood work done today. ? ?   ?  ?Objective:  ?Physical Exam: ?BP (!) 154/69 (BP Location: Left Arm)   Pulse 68   Temp (!) 97.5 ?F (36.4 ?C) (Temporal)   Ht 5\' 6"  (1.676 m)   Wt 180 lb 9.6 oz (81.9 kg)   SpO2 97%   BMI 29.15 kg/m?   ?Gen: No acute distress, resting comfortably ?CV: Regular rate and rhythm with no murmurs appreciated ?Pulm: Normal work of breathing, clear to auscultation bilaterally with no crackles, wheezes, or rhonchi ?Neuro: Grossly normal, moves all extremities ?Psych: Normal affect and thought content ? ?   ? ? . , MD ?03/13/2021 10:42 AM  ?

## 2021-03-13 NOTE — Assessment & Plan Note (Signed)
On Flomax 0.4 mg daily.  We will check PSA. ?

## 2021-03-13 NOTE — Assessment & Plan Note (Signed)
On crestor 20mg  daily. Tolerating well.  ?

## 2021-03-15 NOTE — Progress Notes (Signed)
Please inform patient of the following: ? ?PSA is borderline elevated but stable.  We can recheck in 6 months. ?His iron levels are stable.  ? ?His TSH is in middle of the range however we can try increasing his synthroid to daily if he wishes.  Please send in a prescription if he is agreeable.  He should come back in 6 weeks to recheck. ? ?Everything else is stable.

## 2021-05-15 ENCOUNTER — Other Ambulatory Visit: Payer: Self-pay | Admitting: Family Medicine

## 2021-08-08 ENCOUNTER — Other Ambulatory Visit: Payer: Self-pay | Admitting: Family Medicine

## 2021-08-15 ENCOUNTER — Other Ambulatory Visit: Payer: Self-pay | Admitting: Family Medicine

## 2021-10-07 ENCOUNTER — Encounter: Payer: Self-pay | Admitting: *Deleted

## 2021-11-04 ENCOUNTER — Other Ambulatory Visit: Payer: Self-pay | Admitting: Family Medicine

## 2021-11-05 ENCOUNTER — Other Ambulatory Visit: Payer: Self-pay | Admitting: Family Medicine

## 2021-11-07 ENCOUNTER — Telehealth: Payer: Self-pay

## 2021-11-07 NOTE — Telephone Encounter (Signed)
Creek with me.  Algis Greenhouse. Jerline Pain, MD 11/07/2021 1:57 PM

## 2021-11-07 NOTE — Telephone Encounter (Signed)
Okay for transfer 

## 2021-11-07 NOTE — Telephone Encounter (Signed)
Request transfer to another [place] PCP  Pt is requesting to transfer FROM:  Dr. [name] - [facility]  Pt is requesting to transfer TO:  Dr. Howard Pouch - [facility]  Reason for requested transfer:  Several friends,family is est patient with Dr. Raoul Pitch  Best contact number:  [phone]   *note for scheduling purposes only* Prefer morning appts; available any day except Thursdays

## 2021-11-08 NOTE — Telephone Encounter (Signed)
Okay with me 

## 2021-11-11 DIAGNOSIS — H524 Presbyopia: Secondary | ICD-10-CM | POA: Diagnosis not present

## 2021-11-11 DIAGNOSIS — H2512 Age-related nuclear cataract, left eye: Secondary | ICD-10-CM | POA: Diagnosis not present

## 2021-11-11 NOTE — Telephone Encounter (Signed)
Lmom for patient to call office to schedule TOC appt with Dr. Raoul Pitch

## 2021-11-19 NOTE — Telephone Encounter (Signed)
Patient is scheduled for 11/20 with Dr. Raoul Pitch

## 2021-11-27 ENCOUNTER — Ambulatory Visit: Payer: Medicare PPO | Admitting: Family Medicine

## 2021-11-29 ENCOUNTER — Encounter: Payer: Self-pay | Admitting: Family Medicine

## 2021-11-29 DIAGNOSIS — R972 Elevated prostate specific antigen [PSA]: Secondary | ICD-10-CM | POA: Insufficient documentation

## 2021-11-29 NOTE — Patient Instructions (Incomplete)
Return in about 24 weeks (around 05/19/2022) for Routine chronic condition follow-up.        Great to see you today.  I have refilled the medication(s) we provide.   If labs were collected, we will inform you of lab results once received either by echart message or telephone call.   - echart message- for normal results that have been seen by the patient already.   - telephone call: abnormal results or if patient has not viewed results in their echart.

## 2021-11-29 NOTE — Progress Notes (Unsigned)
Patient ID: Gary Willis, male  DOB: 1940-08-14, 81 y.o.   MRN: [id number] Patient Care Team    Relationship Specialty Notifications Start End  Natalia Leatherwood, DO PCP - General Family Medicine  11/29/21   Maris Berger, MD Consulting Physician Ophthalmology  11/23/18     No chief complaint on file.   Subjective:  Gary Willis is a 81 y.o.  male present for new patient establishment. All past medical history, surgical history, allergies, family history, immunizations, medications and social history were *** in the electronic medical record today. All recent labs, ED visits and hospitalizations within the last year were reviewed.  Health maintenance:  Colonoscopy: completed ***, by ***, resutls ***. follow up ***. Mammogram: completed:***, birads ***.  Cervical cancer screening: last pap: ***, results: ***, completed by: *** Immunizations: tdap ***, Influenza *** (encouraged yearly), PNA series ***, zostavax *** Infectious disease screening: HIV ***, Hep C *** DEXA: last completed ***, result ***, follow needed *** PSA:  Lab Results  Component Value Date   PSA 4.86 (H) 03/13/2021   PSA 4.78 (H) 09/12/2020   Assistive device: *** Oxygen use:**** Patient has a Dental home. Hospitalizations/ED visits:     01/24/2021    9:37 AM 09/12/2020   10:38 AM 01/02/2020    9:37 AM 12/14/2019   11:30 AM 11/23/2018   10:31 AM  Depression screen PHQ 2/9  Decreased Interest 0 0 0 0 0  Down, Depressed, Hopeless 0 0 0 0 0  PHQ - 2 Score 0 0 0 0 0       No data to display                   01/24/2021    9:43 AM 01/02/2020    9:41 AM 11/23/2018   10:31 AM 11/23/2018    8:02 AM  Fall Risk   Falls in the past year? 0 0 0 0  Number falls in past yr: 0 0    Injury with Fall? 0 0 0   Risk for fall due to : Impaired vision Impaired vision    Follow up Falls prevention discussed Falls prevention discussed Falls evaluation completed;Education provided;Falls  prevention discussed      Immunization History  Administered Date(s) Administered   Fluad Quad(high Dose 65+) 09/14/2018, 10/14/2019, 11/05/2021   Influenza-Unspecified 10/13/2011, 11/08/2013, 12/06/2014, 10/02/2015, 11/27/2016, 09/24/2017, 10/14/2017, 11/12/2020   PFIZER(Purple Top)SARS-COV-2 Vaccination 02/18/2019, 03/15/2019   Pneumococcal Conjugate-13 06/15/2013   Pneumococcal Polysaccharide-23 10/06/2008   Td 10/13/1997, 10/06/2008, 07/22/2019   Zoster Recombinat (Shingrix) 07/22/2019, 12/31/2019   Zoster, Live 09/09/2010    No results found.  Past Medical History:  Diagnosis Date   Arthritis    History of fainting spells of unknown cause    Hx of cardiovascular stress test    ETT-Myoview (05/2013):  diaph atten, no ischemia, EF 63%; Low Risk   Hyperlipidemia    Migraines    Thyroid disease    No Known Allergies Past Surgical History:  Procedure Laterality Date   CATARACT EXTRACTION Right 04/2018   ROTATOR CUFF REPAIR Right    2021   TREATMENT FISTULA ANAL     Family History  Problem Relation Age of Onset   Miscarriages / Stillbirths Mother    Cancer Mother    Lung cancer Mother    Hypertension Father    Hyperlipidemia Father    Heart disease Father    Early death Father    Heart attack Father  High Cholesterol Father    Arthritis Sister    Diabetes Sister    Hypertension Sister    Hyperlipidemia Sister    Arthritis Sister    Cancer Maternal Grandmother    Hyperlipidemia Maternal Grandfather    Heart disease Paternal Grandmother    Heart attack Paternal Grandmother    Cancer Paternal Grandfather    Colon cancer Neg Hx    Social History   Social History Narrative   Marital status/children/pets: Divorced.   Education/employment: Retired Programmer, systems, Contractor:      -smoke alarm in the home:Yes     - wears seatbelt: Yes          Allergies as of 12/02/2021   No Known Allergies      Medication List        Accurate as of  November 29, 2021  1:18 PM. If you have any questions, ask your nurse or doctor.          STOP taking these medications    cholecalciferol 25 MCG (1000 UNIT) tablet Commonly known as: VITAMIN D3   ibuprofen 100 MG/5ML suspension Commonly known as: ADVIL   MAGNESIUM PO   VITAMIN C PO       TAKE these medications    co-enzyme Q-10 30 MG capsule Take 30 mg by mouth every morning.   FERROUS SULFATE PO Take by mouth.   glucosamine-chondroitin 500-400 MG tablet Take 1 tablet by mouth 2 (two) times daily.   levothyroxine 112 MCG tablet Commonly known as: SYNTHROID TAKE 1 TABLET BY MOUTH ONCE DAILY BEFORE BREAKFAST   multivitamin with minerals tablet Take 1 tablet by mouth every morning.   rosuvastatin 20 MG tablet Commonly known as: CRESTOR Take 1 tablet by mouth once daily   tamsulosin 0.4 MG Caps capsule Commonly known as: FLOMAX Take 1 capsule by mouth twice daily   TURMERIC PO Take by mouth.        All past medical history, surgical history, allergies, family history, immunizations andmedications were updated in the EMR today and reviewed under the history and medication portions of their EMR.    No results found for this or any previous visit (from the past 2160 hour(s)).  DG Finger Ring Right  Result Date: 02/07/2021 CLINICAL DATA:  Right fourth finger laceration. EXAM: RIGHT RING FINGER 2+V COMPARISON:  None. FINDINGS: There is no evidence of acute fracture or dislocation. Mild degenerative changes seen involving the fourth proximal interphalangeal joint. Soft tissues are unremarkable. IMPRESSION: No acute abnormality seen.  No radiopaque foreign body is noted. Electronically Signed   By: Lupita Raider M.D.   On: 02/07/2021 12:26     ROS 14 pt review of systems performed and negative (unless mentioned in an HPI)  Objective:  There were no vitals taken for this visit.  Physical Exam      Assessment/plan: Gary Willis is a 81 y.o. male  present for ***   Patient was encouraged to exercise greater than 150 minutes a week. Patient was encouraged to choose a diet filled with fresh fruits and vegetables, and lean meats. AVS provided to patient today for education/recommendation on gender specific health and safety maintenance. No follow-ups on file.  No orders of the defined types were placed in this encounter.  No orders of the defined types were placed in this encounter.  Referral Orders  No referral(s) requested today     Note is dictated utilizing voice recognition software. Although note has been  proof read prior to signing, occasional typographical errors still can be missed. If any questions arise, please do not hesitate to call for verification.  Electronically signed by: Felix Pacini, DO [facility] [place]

## 2021-12-02 ENCOUNTER — Ambulatory Visit: Payer: Medicare PPO | Admitting: Family Medicine

## 2021-12-02 ENCOUNTER — Encounter: Payer: Self-pay | Admitting: Family Medicine

## 2021-12-02 VITALS — BP 133/77 | HR 68 | Temp 97.9°F | Ht 66.54 in | Wt 182.2 lb

## 2021-12-02 DIAGNOSIS — E038 Other specified hypothyroidism: Secondary | ICD-10-CM

## 2021-12-02 DIAGNOSIS — R739 Hyperglycemia, unspecified: Secondary | ICD-10-CM

## 2021-12-02 DIAGNOSIS — R3916 Straining to void: Secondary | ICD-10-CM

## 2021-12-02 DIAGNOSIS — M1990 Unspecified osteoarthritis, unspecified site: Secondary | ICD-10-CM

## 2021-12-02 DIAGNOSIS — E063 Autoimmune thyroiditis: Secondary | ICD-10-CM

## 2021-12-02 DIAGNOSIS — N401 Enlarged prostate with lower urinary tract symptoms: Secondary | ICD-10-CM | POA: Diagnosis not present

## 2021-12-02 DIAGNOSIS — R972 Elevated prostate specific antigen [PSA]: Secondary | ICD-10-CM | POA: Diagnosis not present

## 2021-12-02 DIAGNOSIS — R5383 Other fatigue: Secondary | ICD-10-CM

## 2021-12-02 DIAGNOSIS — E785 Hyperlipidemia, unspecified: Secondary | ICD-10-CM | POA: Diagnosis not present

## 2021-12-02 DIAGNOSIS — E611 Iron deficiency: Secondary | ICD-10-CM

## 2021-12-02 MED ORDER — TAMSULOSIN HCL 0.4 MG PO CAPS
0.4000 mg | ORAL_CAPSULE | Freq: Two times a day (BID) | ORAL | 1 refills | Status: DC
Start: 2021-12-02 — End: 2022-05-19

## 2021-12-02 MED ORDER — ROSUVASTATIN CALCIUM 20 MG PO TABS: 20.0000 mg | ORAL_TABLET | Freq: Every day | ORAL | 3 refills | Status: DC

## 2021-12-03 ENCOUNTER — Telehealth: Payer: Self-pay | Admitting: Family Medicine

## 2021-12-03 LAB — PSA, MEDICARE: PSA: 6.19 ng/ml — ABNORMAL HIGH (ref 0.10–4.00)

## 2021-12-03 MED ORDER — LEVOTHYROXINE SODIUM 112 MCG PO TABS: 112.0000 ug | ORAL_TABLET | Freq: Every day | ORAL | 3 refills | Status: DC

## 2021-12-03 NOTE — Telephone Encounter (Signed)
LM for pt to return call regarding results.  

## 2021-12-03 NOTE — Telephone Encounter (Signed)
Please call patient Liver, kidney and thyroid function are normal> I have refilled his thyroid medication Iron panel is normal and at goal. Vitamin D is at the low end of normal.  I would recommend he add 1000 units of vitamin D daily. Vitamin B12 is normal, but could use some improvement.  I would encourage him to start vitamin B12 [phone] mcg daily. Blood cell counts and electrolytes are normal Glucose is normal Inflammatory markers normal. His other labs are still pending .  We will call him with those results once received

## 2021-12-03 NOTE — Telephone Encounter (Signed)
Spoke with pt regarding labs and instructions.   

## 2021-12-04 ENCOUNTER — Telehealth: Payer: Self-pay | Admitting: Family Medicine

## 2021-12-04 DIAGNOSIS — R972 Elevated prostate specific antigen [PSA]: Secondary | ICD-10-CM

## 2021-12-04 LAB — CBC WITH DIFFERENTIAL/PLATELET
Absolute Monocytes: 1032 cells/uL — ABNORMAL HIGH (ref 200–950)
Basophils Absolute: 77 cells/uL (ref 0–200)
Basophils Relative: 0.9 %
Eosinophils Absolute: 198 cells/uL (ref 15–500)
Eosinophils Relative: 2.3 %
HCT: 44.8 % (ref 38.5–50.0)
Hemoglobin: 15.4 g/dL (ref 13.2–17.1)
Lymphs Abs: 2184 cells/uL (ref 850–3900)
MCH: 30.4 pg (ref 27.0–33.0)
MCHC: 34.4 g/dL (ref 32.0–36.0)
MCV: 88.4 fL (ref 80.0–100.0)
MPV: 9.8 fL (ref 7.5–12.5)
Monocytes Relative: 12 %
Neutro Abs: 5108 cells/uL (ref 1500–7800)
Neutrophils Relative %: 59.4 %
Platelets: 251 10*3/uL (ref 140–400)
RBC: 5.07 10*6/uL (ref 4.20–5.80)
RDW: 12.9 % (ref 11.0–15.0)
Total Lymphocyte: 25.4 %
WBC: 8.6 10*3/uL (ref 3.8–10.8)

## 2021-12-04 LAB — COMPREHENSIVE METABOLIC PANEL
AG Ratio: 1.2 (calc) (ref 1.0–2.5)
ALT: 20 U/L (ref 9–46)
AST: 23 U/L (ref 10–35)
Albumin: 4.3 g/dL (ref 3.6–5.1)
Alkaline phosphatase (APISO): 58 U/L (ref 35–144)
BUN: 19 mg/dL (ref 7–25)
CO2: 27 mmol/L (ref 20–32)
Calcium: 9.5 mg/dL (ref 8.6–10.3)
Chloride: 106 mmol/L (ref 98–110)
Creat: 0.89 mg/dL (ref 0.70–1.28)
Globulin: 3.6 g/dL (calc) (ref 1.9–3.7)
Glucose, Bld: 88 mg/dL (ref 65–99)
Potassium: 4.3 mmol/L (ref 3.5–5.3)
Sodium: 141 mmol/L (ref 135–146)
Total Bilirubin: 0.5 mg/dL (ref 0.2–1.2)
Total Protein: 7.9 g/dL (ref 6.1–8.1)

## 2021-12-04 LAB — ANA,IFA RA DIAG PNL W/RFLX TIT/PATN
Anti Nuclear Antibody (ANA): NEGATIVE
Cyclic Citrullin Peptide Ab: 31 UNITS — ABNORMAL HIGH
Rheumatoid fact SerPl-aCnc: <14 IU/mL (ref <14)

## 2021-12-04 LAB — VITAMIN D 25 HYDROXY (VIT D DEFICIENCY, FRACTURES): Vit D, 25-Hydroxy: 36 ng/mL (ref 30–100)

## 2021-12-04 LAB — IRON,TIBC AND FERRITIN PANEL
%SAT: 20 % (calc) (ref 20–48)
Ferritin: 59 ng/mL (ref 24–380)
Iron: 69 ug/dL (ref 50–180)
TIBC: 345 mcg/dL (calc) (ref 250–425)

## 2021-12-04 LAB — SEDIMENTATION RATE: Sed Rate: 9 mm/h (ref 0–20)

## 2021-12-04 LAB — VITAMIN B12: Vitamin B-12: 576 pg/mL (ref 200–1100)

## 2021-12-04 LAB — TSH: TSH: 3.71 mIU/L (ref 0.40–4.50)

## 2021-12-04 LAB — T4, FREE: Free T4: 1 ng/dL (ref 0.8–1.8)

## 2021-12-04 NOTE — Telephone Encounter (Signed)
Spoke with pt regarding labs and instructions.   

## 2021-12-04 NOTE — Telephone Encounter (Signed)
Please inform patient his prostate cancer screening lab is higher than it has been in the past at 6.19.  Since it has continued to slowly rise since at least 2022 when I can see the results at 4.78, 4.86 and now 6.19-I do recommend he be evaluated by urology.  I have placed that referral for him.  It usually takes some time to get into them but he should be hearing from them to at least schedule within the next couple weeks.

## 2021-12-09 ENCOUNTER — Telehealth: Payer: Self-pay | Admitting: Family Medicine

## 2021-12-09 DIAGNOSIS — M25541 Pain in joints of right hand: Secondary | ICD-10-CM

## 2021-12-09 DIAGNOSIS — M7989 Other specified soft tissue disorders: Secondary | ICD-10-CM

## 2021-12-09 DIAGNOSIS — M2550 Pain in unspecified joint: Secondary | ICD-10-CM | POA: Insufficient documentation

## 2021-12-09 NOTE — Telephone Encounter (Signed)
LVM for pt to CB regarding results.  

## 2021-12-09 NOTE — Telephone Encounter (Signed)
LM for pt to return call to discuss if any concerns

## 2021-12-09 NOTE — Telephone Encounter (Signed)
Patient returning call about lab results.  Please call patient back when available. "His arthritis panel has an elevated CCP antibody.  This is a marker that is consistent with rheumatoid arthritis.  - I have referred him to rheumatology for further evaluation and discussion on treatment options."   No further questions/concerns at this time.  Patient will await call from rheumatology office.

## 2021-12-09 NOTE — Telephone Encounter (Signed)
Please call patient His arthritis panel has an elevated CCP antibody.  This is a marker that is consistent with rheumatoid arthritis.  - I have referred him to rheumatology for further evaluation and discussion on treatment options.

## 2021-12-17 ENCOUNTER — Ambulatory Visit (HOSPITAL_COMMUNITY)
Admission: RE | Admit: 2021-12-17 | Discharge: 2021-12-17 | Disposition: A | Discharge status: Home / Self Care | Payer: Medicare PPO | Source: Ambulatory Visit | Attending: Family Medicine | Admitting: Family Medicine

## 2021-12-17 ENCOUNTER — Encounter (HOSPITAL_COMMUNITY): Payer: Self-pay

## 2021-12-17 DIAGNOSIS — E785 Hyperlipidemia, unspecified: Secondary | ICD-10-CM | POA: Insufficient documentation

## 2021-12-18 ENCOUNTER — Telehealth: Payer: Self-pay | Admitting: Family Medicine

## 2021-12-18 DIAGNOSIS — R931 Abnormal findings on diagnostic imaging of heart and coronary circulation: Secondary | ICD-10-CM

## 2021-12-18 DIAGNOSIS — I779 Disorder of arteries and arterioles, unspecified: Secondary | ICD-10-CM

## 2021-12-18 DIAGNOSIS — E785 Hyperlipidemia, unspecified: Secondary | ICD-10-CM

## 2021-12-18 DIAGNOSIS — I700 Atherosclerosis of aorta: Secondary | ICD-10-CM

## 2021-12-18 NOTE — Telephone Encounter (Signed)
LM for pt to return call regarding results.  

## 2021-12-18 NOTE — Telephone Encounter (Signed)
Patient advised of results.

## 2021-12-18 NOTE — Telephone Encounter (Signed)
Please inform patient: CT findings: Coronary Calcium Score: 468 Percentile: 54 Aortic atherosclerosis/plaque present.  Coronary calcium score higher than 400, is at higher risk and referral to cardiology is recommended.  I have placed this referral for him at the [facility].  They should be reaching out to contact him.  They will guide him on any further recommendations studies medications etc.

## 2022-01-14 ENCOUNTER — Telehealth: Payer: Self-pay | Admitting: *Deleted

## 2022-01-14 DIAGNOSIS — R3911 Hesitancy of micturition: Secondary | ICD-10-CM | POA: Diagnosis not present

## 2022-01-14 DIAGNOSIS — R972 Elevated prostate specific antigen [PSA]: Secondary | ICD-10-CM | POA: Diagnosis not present

## 2022-01-14 NOTE — Patient Outreach (Signed)
  Care Coordination   01/14/2022 Name: Gary Willis MRN: [id number] DOB: 03/16/1940   Care Coordination Outreach Attempts:  An unsuccessful telephone outreach was attempted today to offer the patient information about available care coordination services as a benefit of their health plan.   Follow Up Plan:  Additional outreach attempts will be made to offer the patient care coordination information and services.   Encounter Outcome:  No Answer   Care Coordination Interventions:  No, not indicated    Raina Mina, RN Care Management Coordinator [facility] [phone]

## 2022-01-16 DIAGNOSIS — H2512 Age-related nuclear cataract, left eye: Secondary | ICD-10-CM | POA: Diagnosis not present

## 2022-01-16 DIAGNOSIS — H21562 Pupillary abnormality, left eye: Secondary | ICD-10-CM | POA: Diagnosis not present

## 2022-01-16 DIAGNOSIS — H269 Unspecified cataract: Secondary | ICD-10-CM | POA: Diagnosis not present

## 2022-01-21 ENCOUNTER — Encounter (HOSPITAL_BASED_OUTPATIENT_CLINIC_OR_DEPARTMENT_OTHER): Payer: Self-pay | Admitting: Cardiology

## 2022-01-21 ENCOUNTER — Ambulatory Visit (HOSPITAL_BASED_OUTPATIENT_CLINIC_OR_DEPARTMENT_OTHER): Payer: Medicare PPO | Admitting: Cardiology

## 2022-01-21 VITALS — BP 124/68 | HR 60 | Ht 66.5 in | Wt 181.8 lb

## 2022-01-21 DIAGNOSIS — Z712 Person consulting for explanation of examination or test findings: Secondary | ICD-10-CM | POA: Diagnosis not present

## 2022-01-21 DIAGNOSIS — Z8249 Family history of ischemic heart disease and other diseases of the circulatory system: Secondary | ICD-10-CM

## 2022-01-21 DIAGNOSIS — Z7189 Other specified counseling: Secondary | ICD-10-CM

## 2022-01-21 DIAGNOSIS — I700 Atherosclerosis of aorta: Secondary | ICD-10-CM | POA: Diagnosis not present

## 2022-01-21 DIAGNOSIS — I2510 Atherosclerotic heart disease of native coronary artery without angina pectoris: Secondary | ICD-10-CM

## 2022-01-21 MED ORDER — ASPIRIN 81 MG PO TBEC: 81.0000 mg | DELAYED_RELEASE_TABLET | Freq: Every day | ORAL | 3 refills | Status: AC

## 2022-01-21 NOTE — Progress Notes (Signed)
Cardiology Office Note:    Date:  01/24/2022   ID:  Gary Willis, DOB [birth year], MRN [id number]  PCP:  Ma Hillock, DO  Cardiologist:  Buford Dresser, MD  Referring MD: Ma Hillock, DO   CC: new patient evaluation for elevated calcium score  History of Present Illness:    Gary Willis is a 82 y.o. male with a hx of HLD, Thyroid disease, syncope, who is seen as a new consult at the request of Kuneff, Renee A, DO for the evaluation and management of elevated calcium score  Coronary Artery Calcium score was above 400, telephone encounter from 12/18/21 recommended referral to cardiology.  Today, the patient states that late last summer he was unable to walk easily while playing golf, especially during the last few holes. He had noted no chest pain or heaviness associated with his SOB. He takes 4 ibuprofen a day for his arthritis.  He has been on a cholesterol medication for a long time  Cardiovascular risk factors: HLD Prior clinical ASCVD: none Comorbid conditions: HLD Metabolic syndrome/Obesity: none Chronic inflammatory conditions: none Tobacco use history: smokeless chew tobacco, never cigarettes Family history: His father passed away at 32 with a heart attack, had his first one at 93. His grandmother on his dads side died suddenly of a heart attack in her late 3's. His maternal grandfather died of an aneurism. His mother passed away with lung cancer. He has a history of diabetes as well. Prior pertinent testing and/or incidental findings: calcium score of 468, 54th percentile Exercise level: active, piliates twice a week, he walks a few miles often, he rides his stationary bike. He plays a lot of golf and goes to the gym. He has significantly improved his endurance in the past month Current diet:   He denies any palpitations, chest pain, or peripheral edema. No lightheadedness, headaches, syncope, orthopnea, or PND.   Past Medical History:  Diagnosis Date    Arthritis    History of fainting spells of unknown cause    Hx of cardiovascular stress test    ETT-Myoview (05/2013):  diaph atten, no ischemia, EF 63%; Low Risk   Hyperlipidemia    Migraines    Thyroid disease     Past Surgical History:  Procedure Laterality Date   CATARACT EXTRACTION Right 04/2018   ROTATOR CUFF REPAIR Right    2021   TREATMENT FISTULA ANAL      Current Medications: Current Outpatient Medications on File Prior to Visit  Medication Sig   co-enzyme Q-10 30 MG capsule Take 30 mg by mouth every morning.   FERROUS SULFATE PO Take by mouth.   glucosamine-chondroitin 500-400 MG tablet Take 1 tablet by mouth 2 (two) times daily.   levothyroxine (SYNTHROID) 112 MCG tablet Take 1 tablet (112 mcg total) by mouth daily before breakfast.   Multiple Vitamins-Minerals (MULTIVITAMIN WITH MINERALS) tablet Take 1 tablet by mouth every morning.   rosuvastatin (CRESTOR) 20 MG tablet Take 1 tablet (20 mg total) by mouth daily.   tamsulosin (FLOMAX) 0.4 MG CAPS capsule Take 1 capsule (0.4 mg total) by mouth 2 (two) times daily.   TURMERIC PO Take by mouth.   No current facility-administered medications on file prior to visit.     Allergies:   Patient has no known allergies.   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  Vaping Use: Never used  Substance Use Topics   Alcohol use: Yes    Alcohol/week: 1.0 standard drink of alcohol    Types: 1 Cans of beer per week    Comment: social   Drug use: No    Family History: family history includes Arthritis in his sister and sister; Cancer in his maternal grandmother, mother, and paternal grandfather; Diabetes in his sister; Early death in his father; Heart attack in his father and paternal grandmother; Heart disease in his father and paternal grandmother; High Cholesterol in his father; Hyperlipidemia in his father, maternal  grandfather, and sister; Hypertension in his father and sister; Lung cancer in his mother; Miscarriages / [place] in his mother. There is no history of Colon cancer.  ROS:   Please see the history of present illness.   (+) SOB Additional pertinent ROS otherwise unremarkable EKGs/Labs/Other Studies Reviewed:    The following studies were reviewed today:  12/17/2021: CT Cardiac Scoring  FINDINGS: Cardiovascular: There are no significant extracardiac vascular findings.   Mediastinum/Nodes: There are no enlarged lymph nodes within the visualized mediastinum.   Lungs/Pleura: There is no pleural effusion. The visualized lungs appear clear.   Upper abdomen: No significant findings in the visualized upper abdomen.   Musculoskeletal/Chest wall: Mild multilevel degenerative disc changes of the visualized lower thoracic spine with large anterior right bridging osteophytes.   IMPRESSION: No significant extracardiac findings within the visualized chest.   EKG: The EKG is personally reviewed.   01/21/22: NSR at 60 bpm  Recent Labs: 12/02/2021: ALT 20; BUN 19; Creat 0.89; Hemoglobin 15.4; Platelets 251; Potassium 4.3; Sodium 141; TSH 3.71  Recent Lipid Panel    Component Value Date/Time   CHOL 126 09/12/2020 1112   TRIG 99.0 09/12/2020 1112   HDL 51.30 09/12/2020 1112   CHOLHDL 2 09/12/2020 1112   VLDL 19.8 09/12/2020 1112   LDLCALC 55 09/12/2020 1112    Physical Exam:    VS:  BP 124/68 (BP Location: Right Arm, Patient Position: Sitting, Cuff Size: Normal)   Pulse 60   Ht 5' 6.5" (1.689 m)   Wt 181 lb 12.8 oz (82.5 kg)   BMI 28.90 kg/m     Wt Readings from Last 3 Encounters:  01/21/22 181 lb 12.8 oz (82.5 kg)  12/02/21 182 lb 3.2 oz (82.6 kg)  03/13/21 180 lb 9.6 oz (81.9 kg)    GEN: Well nourished, well developed in no acute distress HEENT: Normal, moist mucous membranes NECK: No JVD CARDIAC: regular rhythm, normal S1 and S2, no rubs or gallops. No  murmur. VASCULAR: Radial and DP pulses 2+ bilaterally. No carotid bruits RESPIRATORY:  Clear to auscultation without rales, wheezing or rhonchi  ABDOMEN: Soft, non-tender, non-distended MUSCULOSKELETAL:  Ambulates independently SKIN: Warm and dry, no edema NEUROLOGIC:  Alert and oriented x 3. No focal neuro deficits noted. PSYCHIATRIC:  Normal affect    ASSESSMENT:    1. Nonocclusive coronary atherosclerosis of native coronary artery   2. Aortic atherosclerosis (HCC)   3. Family history of premature coronary artery disease   4. Encounter to discuss test results   5. Cardiac risk counseling   6. Counseling on health promotion and disease prevention    PLAN:    Elevated coronary calcium score, consistent with nonobstructive CAD Aortic atherosclerosis Family history of premature CAD -reviewed his test results together today -while his score is >31, he is 82 years old, and his percentile is 84. He is also very active without limitations or symptoms. He  is already on rosuvastatin, and his LDL was 55. I would continue current management -we did discuss recommendations for aspirin 81 mg daily, both pros/cons. He would like to defer this. -reviewed red flag warning signs that need immediate medical attention  Cardiac risk counseling and prevention recommendations: -recommend heart healthy/Mediterranean diet, with whole grains, fruits, vegetable, fish, lean meats, nuts, and olive oil. Limit salt. -recommend moderate walking, 3-5 times/week for 30-50 minutes each session. Aim for at least 150 minutes.week. Goal should be pace of 3 miles/hours, or walking 1.5 miles in 30 minutes -recommend avoidance of tobacco products. Avoid excess alcohol.  Plan for follow up: PRN  Jodelle Red, MD, PhD, [facility] [place]  [facility]    Medication Adjustments/Labs and Tests Ordered: Current medicines are reviewed at length with the patient today.  Concerns regarding medicines are outlined  above.  Orders Placed This Encounter  Procedures   EKG 12-Lead   Meds ordered this encounter  Medications   aspirin EC 81 MG tablet    Sig: Take 1 tablet (81 mg total) by mouth daily. Swallow whole.    Dispense:  90 tablet    Refill:  3   Patient Instructions  Medication Instructions:  Your physician has recommended you make the following change in your medication:  Start: Aspirin 81mg  daily- Over the Counter- Please get the ENTERIC OR SAFETY COATED  *If you need a refill on your cardiac medications before your next appointment, please call your pharmacy*  Follow-Up: At [facility], you and your health needs are our priority.  As part of our continuing mission to provide you with exceptional heart care, we have created designated Provider Care Teams.  These Care Teams include your primary Cardiologist (physician) and Advanced Practice Providers (APPs -  Physician Assistants and Nurse Practitioners) who all work together to provide you with the care you need, when you need it.  We recommend signing up for the patient portal called "MyChart".  Sign up information is provided on this After Visit Summary.  MyChart is used to connect with patients for Virtual Visits (Telemedicine).  Patients are able to view lab/test results, encounter notes, upcoming appointments, etc.  Non-urgent messages can be sent to your provider as well.   To learn more about what you can do with MyChart, go to [facility].    Your next appointment:   Call [email or web address] if you need [place]!    I,Coren O'Brien,acting as a [place] for Neurosurgeon, MD.,have documented all relevant documentation on the behalf of [employer], MD,as directed by  Jodelle Red, MD while in the presence of Jodelle Red, MD.   I, Jodelle Red, MD, have reviewed all documentation for this visit. The documentation on 01/24/22 for the exam, diagnosis, procedures, and orders are all accurate and  complete.   Signed, 03/25/22, MD PhD 01/24/2022 10:43 AM    [facility]

## 2022-01-21 NOTE — Patient Instructions (Signed)
Medication Instructions:  Your physician has recommended you make the following change in your medication:  Start: Aspirin 81mg  daily- Over the Counter- Please get the [place]  *If you need a refill on your cardiac medications before your next appointment, please call your pharmacy*  Follow-Up: At [facility], you and your health needs are our priority.  As part of our continuing mission to provide you with exceptional heart care, we have created designated Provider Care Teams.  These Care Teams include your primary Cardiologist (physician) and Advanced Practice Providers (APPs -  Physician Assistants and Nurse Practitioners) who all work together to provide you with the care you need, when you need it.  We recommend signing up for the patient portal called "MyChart".  Sign up information is provided on this After Visit Summary.  MyChart is used to connect with patients for Virtual Visits (Telemedicine).  Patients are able to view lab/test results, encounter notes, upcoming appointments, etc.  Non-urgent messages can be sent to your provider as well.   To learn more about what you can do with MyChart, go to [email or web address].    Your next appointment:   Call us if you need [place]!

## 2022-01-27 NOTE — Patient Instructions (Signed)
Health Maintenance, Male Adopting a healthy lifestyle and getting preventive care are important in promoting health and wellness. Ask your health care provider about: The right schedule for you to have regular tests and exams. Things you can do on your own to prevent diseases and keep yourself healthy. What should I know about diet, weight, and exercise? Eat a healthy diet  Eat a diet that includes plenty of vegetables, fruits, low-fat dairy products, and lean protein. Do not eat a lot of foods that are high in solid fats, added sugars, or sodium. Maintain a healthy weight Body mass index (BMI) is a measurement that can be used to identify possible weight problems. It estimates body fat based on height and weight. Your health care provider can help determine your BMI and help you achieve or maintain a healthy weight. Get regular exercise Get regular exercise. This is one of the most important things you can do for your health. Most adults should: Exercise for at least 150 minutes each week. The exercise should increase your heart rate and make you sweat (moderate-intensity exercise). Do strengthening exercises at least twice a week. This is in addition to the moderate-intensity exercise. Spend less time sitting. Even light physical activity can be beneficial. Watch cholesterol and blood lipids Have your blood tested for lipids and cholesterol at 81 years of age, then have this test every 5 years. You may need to have your cholesterol levels checked more often if: Your lipid or cholesterol levels are high. You are older than 81 years of age. You are at high risk for heart disease. What should I know about cancer screening? Many types of cancers can be detected early and may often be prevented. Depending on your health history and family history, you may need to have cancer screening at various ages. This may include screening for: Colorectal cancer. Prostate cancer. Skin cancer. Lung  cancer. What should I know about heart disease, diabetes, and high blood pressure? Blood pressure and heart disease High blood pressure causes heart disease and increases the risk of stroke. This is more likely to develop in people who have high blood pressure readings or are overweight. Talk with your health care provider about your target blood pressure readings. Have your blood pressure checked: Every 3-5 years if you are 18-39 years of age. Every year if you are 40 years old or older. If you are between the ages of 65 and 75 and are a current or former smoker, ask your health care provider if you should have a one-time screening for abdominal aortic aneurysm (AAA). Diabetes Have regular diabetes screenings. This checks your fasting blood sugar level. Have the screening done: Once every three years after age 45 if you are at a normal weight and have a low risk for diabetes. More often and at a younger age if you are overweight or have a high risk for diabetes. What should I know about preventing infection? Hepatitis B If you have a higher risk for hepatitis B, you should be screened for this virus. Talk with your health care provider to find out if you are at risk for hepatitis B infection. Hepatitis C Blood testing is recommended for: Everyone born from 1945 through 1965. Anyone with known risk factors for hepatitis C. Sexually transmitted infections (STIs) You should be screened each year for STIs, including gonorrhea and chlamydia, if: You are sexually active and are younger than 81 years of age. You are older than 81 years of age and your   health care provider tells you that you are at risk for this type of infection. Your sexual activity has changed since you were last screened, and you are at increased risk for chlamydia or gonorrhea. Ask your health care provider if you are at risk. Ask your health care provider about whether you are at high risk for HIV. Your health care provider  may recommend a prescription medicine to help prevent HIV infection. If you choose to take medicine to prevent HIV, you should first get tested for HIV. You should then be tested every 3 months for as long as you are taking the medicine. Follow these instructions at home: Alcohol use Do not drink alcohol if your health care provider tells you not to drink. If you drink alcohol: Limit how much you have to 0-2 drinks a day. Know how much alcohol is in your drink. In the [place] one drink equals one 12 oz bottle of beer (355 mL), one 5 oz glass of wine (148 mL), or one 1 oz glass of hard liquor (44 mL). Lifestyle Do not use any products that contain nicotine or tobacco. These products include cigarettes, chewing tobacco, and vaping devices, such as e-cigarettes. If you need help quitting, ask your health care provider. Do not use street drugs. Do not share needles. Ask your health care provider for help if you need support or information about quitting drugs. General instructions Schedule regular health, dental, and eye exams. Stay current with your vaccines. Tell your health care provider if: You often feel depressed. You have ever been abused or do not feel safe at home. Summary Adopting a healthy lifestyle and getting preventive care are important in promoting health and wellness. Follow your health care provider's instructions about healthy diet, exercising, and getting tested or screened for diseases. Follow your health care provider's instructions on monitoring your cholesterol and blood pressure. This information is not intended to replace advice given to you by your health care provider. Make sure you discuss any questions you have with your health care provider. Document Revised: 05/21/2020 Document Reviewed: 05/21/2020 Elsevier Patient Education  2023 [employer]  

## 2022-01-27 NOTE — Progress Notes (Signed)
Subjective:   Gary Willis is a 82 y.o. male who presents for Medicare Annual/Subsequent preventive examination.  Review of Systems    Defer to PCP Cardiac Risk Factors include: dyslipidemia;advanced age (>66men, >32 women);male gender     Objective:    There were no vitals filed for this visit. There is no height or weight on file to calculate BMI.     01/29/2022    9:19 AM 02/07/2021   11:06 AM 01/24/2021    9:40 AM 01/02/2020    9:39 AM 11/23/2018   10:30 AM  Advanced Directives  Does Patient Have a Medical Advance Directive? No Yes Yes Yes No  Type of Advance Directive    Living will   Does patient want to make changes to medical advance directive?   Yes (MAU/Ambulatory/Procedural Areas - Information given)    Would patient like information on creating a medical advance directive? No - Patient declined    Yes (MAU/Ambulatory/Procedural Areas - Information given)    Current Medications (verified) Outpatient Encounter Medications as of 01/29/2022  Medication Sig   aspirin EC 81 MG tablet Take 1 tablet (81 mg total) by mouth daily. Swallow whole.   co-enzyme Q-10 30 MG capsule Take 30 mg by mouth every morning.   FERROUS SULFATE PO Take by mouth.   glucosamine-chondroitin 500-400 MG tablet Take 1 tablet by mouth 2 (two) times daily.   levothyroxine (SYNTHROID) 112 MCG tablet Take 1 tablet (112 mcg total) by mouth daily before breakfast.   Multiple Vitamins-Minerals (MULTIVITAMIN WITH MINERALS) tablet Take 1 tablet by mouth every morning.   rosuvastatin (CRESTOR) 20 MG tablet Take 1 tablet (20 mg total) by mouth daily.   tamsulosin (FLOMAX) 0.4 MG CAPS capsule Take 1 capsule (0.4 mg total) by mouth 2 (two) times daily.   TURMERIC PO Take by mouth.   No facility-administered encounter medications on file as of 01/29/2022.    Allergies (verified) Patient has no known allergies.   History: Past Medical History:  Diagnosis Date   Arthritis    History of fainting spells  of unknown cause    Hx of cardiovascular stress test    ETT-Myoview (05/2013):  diaph atten, no ischemia, EF 63%; Low Risk   Hyperlipidemia    Migraines    Thyroid disease    Past Surgical History:  Procedure Laterality Date   CATARACT EXTRACTION Right 04/2018   ROTATOR CUFF REPAIR Right    2021   TREATMENT FISTULA ANAL     Family History  Problem Relation Age of Onset   Miscarriages / Stillbirths Mother    Cancer Mother    Lung cancer Mother    Hypertension Father    Hyperlipidemia Father    Heart disease Father    Early death Father    Heart attack Father    High Cholesterol Father    Arthritis Sister    Diabetes Sister    Hypertension Sister    Hyperlipidemia Sister    Arthritis Sister    Cancer Maternal Grandmother    Hyperlipidemia Maternal Grandfather    Heart disease Paternal Grandmother    Heart attack Paternal Grandmother    Cancer Paternal Grandfather    Colon cancer Neg Hx    Social History   Socioeconomic History   Marital status: Divorced    Spouse name: Not on file   Number of children: Not on file   Years of education: Not on file   Highest education level: Not on file  Occupational  History   Occupation: retired  Tobacco Use   Smoking status: Never    Passive exposure: Never   Smokeless tobacco: Former    Types: Associate Professor Use: Never used  Substance and Sexual Activity   Alcohol use: Yes    Alcohol/week: 1.0 standard drink of alcohol    Types: 1 Cans of beer per week    Comment: social   Drug use: No   Sexual activity: Not Currently    Partners: Female  Other Topics Concern   Not on file  Social History Narrative   Marital status/children/pets: Divorced.   Education/employment: Retired Programmer, systems, Contractor:      -smoke alarm in the home:Yes     - wears seatbelt: Yes         Social Determinants of Health   Financial Resource Strain: Low Risk  (01/29/2022)   Overall Financial Resource Strain (CARDIA)     Difficulty of Paying Living Expenses: Not hard at all  Food Insecurity: No Food Insecurity (01/29/2022)   Hunger Vital Sign    Worried About Running Out of Food in the Last Year: Never true    Ran Out of Food in the Last Year: Never true  Transportation Needs: No Transportation Needs (01/29/2022)   PRAPARE - Administrator, Civil Service (Medical): No    Lack of Transportation (Non-Medical): No  Physical Activity: Sufficiently Active (01/29/2022)   Exercise Vital Sign    Days of Exercise per Week: 5 days    Minutes of Exercise per Session: 60 min  Stress: No Stress Concern Present (01/29/2022)   [employer] of Occupational Health - Occupational Stress Questionnaire    Feeling of Stress : Not at all  Social Connections: Moderately Isolated (01/29/2022)   Social Connection and Isolation Panel [NHANES]    Frequency of Communication with Friends and Family: More than three times a week    Frequency of Social Gatherings with Friends and Family: More than three times a week    Attends Religious Services: Never    Database administrator or Organizations: Yes    Attends Engineer, structural: 1 to 4 times per year    Marital Status: Divorced    Tobacco Counseling Counseling given: Not Answered   Clinical Intake:  Pre-visit preparation completed: No  Pain : No/denies pain     Nutritional Risks: None Diabetes: No  How often do you need to have someone help you when you read instructions, pamphlets, or other written materials from your doctor or pharmacy?: 1 - Never What is the last grade level you completed in school?: Master's degree +  Diabetic?no  Interpreter Needed?: No      Activities of Daily Living    01/29/2022    9:17 AM  In your present state of health, do you have any difficulty performing the following activities:  Hearing? 1  Vision? 0  Difficulty concentrating or making decisions? 0  Walking or climbing stairs? 0  Dressing or  bathing? 0  Doing errands, shopping? 0  Preparing Food and eating ? N  Using the Toilet? N  In the past six months, have you accidently leaked urine? N  Do you have problems with loss of bowel control? N  Managing your Medications? N  Managing your Finances? N  Housekeeping or managing your Housekeeping? N    Patient Care Team: Natalia Leatherwood, DO as PCP - General (Family Medicine) Jodelle Red, MD  as PCP - Cardiology (Cardiology) Luberta Mutter, MD as Consulting Physician (Ophthalmology)  Indicate any recent Medical Services you may have received from other than [facility] providers in the past year (date may be approximate).     Assessment:   This is a routine wellness examination for Cara.  Hearing/Vision screen No results found.  Dietary issues and exercise activities discussed: Current Exercise Habits: Home exercise routine, Type of exercise: walking;yoga, Time (Minutes): > 60, Frequency (Times/Week): 6, Weekly Exercise (Minutes/Week): 0, Exercise limited by: None identified   Goals Addressed   None   Depression Screen    01/29/2022    9:17 AM 01/24/2021    9:37 AM 09/12/2020   10:38 AM 01/02/2020    9:37 AM 12/14/2019   11:30 AM 11/23/2018   10:31 AM 11/23/2018    8:10 AM  PHQ 2/9 Scores  PHQ - 2 Score 0 0 0 0 0 0 0    Fall Risk    01/29/2022    9:17 AM 01/24/2021    9:43 AM 01/02/2020    9:41 AM 11/23/2018   10:31 AM 11/23/2018    8:02 AM  Fall Risk   Falls in the past year? 0 0 0 0 0  Number falls in past yr: 0 0 0    Injury with Fall? 0 0 0 0   Risk for fall due to : No Fall Risks Impaired vision Impaired vision    Follow up Falls evaluation completed Falls prevention discussed Falls prevention discussed Falls evaluation completed;Education provided;Falls prevention discussed     FALL RISK PREVENTION PERTAINING TO THE HOME:  Any stairs in or around the home? No  If so, are there any without handrails? No  Home free of loose throw rugs in  walkways, pet beds, electrical cords, etc? No  Adequate lighting in your home to reduce risk of falls? No   ASSISTIVE DEVICES UTILIZED TO PREVENT FALLS:  Life alert? No  Use of a cane, walker or w/c? No  Grab bars in the bathroom? Yes  Shower chair or bench in shower? Yes  Elevated toilet seat or a handicapped toilet? Yes   TIMED UP AND GO:  Was the test performed? Yes .  Length of time to ambulate 10 feet: 8 sec.   Gait steady and fast without use of assistive device  Cognitive Function:        01/29/2022    9:22 AM 01/24/2021    9:47 AM 11/23/2018   10:31 AM  6CIT Screen  What Year? 0 points 0 points 0 points  What month? 0 points 0 points 0 points  What time? 0 points 0 points 0 points  Count back from 20 0 points 0 points 0 points  Months in reverse 0 points 0 points 0 points  Repeat phrase 2 points 0 points 0 points  Total Score 2 points 0 points 0 points    Immunizations Immunization History  Administered Date(s) Administered   Fluad Quad(high Dose 65+) 09/14/2018, 10/14/2019, 11/05/2021   Influenza-Unspecified 10/13/2011, 11/08/2013, 12/06/2014, 10/02/2015, 11/27/2016, 09/24/2017, 10/14/2017, 11/12/2020   PFIZER(Purple Top)SARS-COV-2 Vaccination 02/18/2019, 03/15/2019   Pneumococcal Conjugate-13 06/15/2013   Pneumococcal Polysaccharide-23 10/06/2008   Td 10/13/1997, 10/06/2008, 07/22/2019   Zoster Recombinat (Shingrix) 07/22/2019, 12/31/2019   Zoster, Live 09/09/2010    TDAP status: Up to date  Flu Vaccine status: Up to date  Pneumococcal vaccine status: Up to date  Covid-19 vaccine status: Completed vaccines  Qualifies for Shingles Vaccine? Yes   Zostavax completed No  Shingrix Completed?: Yes  Screening Tests Health Maintenance  Topic Date Due   Medicare Annual Wellness (AWV)  01/30/2023   DTaP/Tdap/Td (4 - Tdap) 07/21/2029   Pneumonia Vaccine 46+ Years old  Completed   INFLUENZA VACCINE  Completed   Zoster Vaccines- Shingrix  Completed    HPV VACCINES  Aged Out   COVID-19 Vaccine  Discontinued    Health Maintenance  There are no preventive care reminders to display for this patient.   Colorectal cancer screening: No longer required.   Lung Cancer Screening: (Low Dose CT Chest recommended if Age 10-80 years, 30 pack-year currently smoking OR have quit w/in 15years.) does not qualify.   Lung Cancer Screening Referral: n/a  Additional Screening:  Hepatitis C Screening: does not qualify; Completed n/a  Vision Screening: Recommended annual ophthalmology exams for early detection of glaucoma and other disorders of the eye. Is the patient up to date with their annual eye exam?  Yes  Who is the provider or what is the name of the office in which the patient attends annual eye exams? Maris Berger, MD If pt is not established with a provider, would they like to be referred to a provider to establish care? No .   Dental Screening: Recommended annual dental exams for proper oral hygiene  Community Resource Referral / Chronic Care Management: CRR required this visit?  No   CCM required this visit?  No      Plan:     I have personally reviewed and noted the following in the patient's chart:   Medical and social history Use of alcohol, tobacco or illicit drugs  Current medications and supplements including opioid prescriptions. Patient is not currently taking opioid prescriptions. Functional ability and status Nutritional status Physical activity Advanced directives List of other physicians Hospitalizations, surgeries, and ER visits in previous 12 months Vitals Screenings to include cognitive, depression, and falls Referrals and appointments  In addition, I have reviewed and discussed with patient certain preventive protocols, quality metrics, and best practice recommendations. A written personalized care plan for preventive services as well as general preventive health recommendations were provided to  patient.     Filomena Jungling, CMA   01/29/2022   Nurse Notes: Non-Face to Face or Face to Face 15 minute visit Encounter    Mr. Ellenwood , Thank you for taking time to come for your Medicare Wellness Visit. I appreciate your ongoing commitment to your health goals. Please review the following plan we discussed and let me know if I can assist you in the future.   These are the goals we discussed:  Goals      Patient Stated     Remain healthy and keep exercising      Patient Stated     Back with silver sneak         This is a list of the screening recommended for you and due dates:  Health Maintenance  Topic Date Due   Medicare Annual Wellness Visit  01/30/2023   DTaP/Tdap/Td vaccine (4 - Tdap) 07/21/2029   Pneumonia Vaccine  Completed   Flu Shot  Completed   Zoster (Shingles) Vaccine  Completed   HPV Vaccine  Aged Out   COVID-19 Vaccine  Discontinued

## 2022-01-29 ENCOUNTER — Ambulatory Visit (INDEPENDENT_AMBULATORY_CARE_PROVIDER_SITE_OTHER): Payer: Medicare PPO

## 2022-01-29 VITALS — BP 132/68 | HR 62 | Temp 98.1°F | Ht 66.5 in | Wt 180.4 lb

## 2022-01-29 DIAGNOSIS — Z0000 Encounter for general adult medical examination without abnormal findings: Secondary | ICD-10-CM

## 2022-03-12 ENCOUNTER — Encounter: Payer: Self-pay | Admitting: Family Medicine

## 2022-03-12 ENCOUNTER — Ambulatory Visit: Payer: Medicare PPO | Admitting: Family Medicine

## 2022-03-12 VITALS — BP 117/62 | HR 66 | Temp 97.8°F | Wt 181.2 lb

## 2022-03-12 DIAGNOSIS — K130 Diseases of lips: Secondary | ICD-10-CM

## 2022-03-12 NOTE — Progress Notes (Signed)
Gary Willis , [birth year], 82 y.o., male MRN: [id number] Patient Care Team    Relationship Specialty Notifications Start End  Ma Hillock, DO PCP - General Family Medicine  11/29/21   Buford Dresser, MD PCP - Cardiology Cardiology  01/24/22   Luberta Mutter, MD Consulting Physician Ophthalmology  11/23/18     Chief Complaint  Patient presents with   Sunburn    Bottom lips; 3 weeks; got new razor and does not know if that may have caused an issue     Subjective: Gary Willis is a 82 y.o. Pt presents for an OV with complaints of lower lip discomfort of 3 weeks duration.  He states he went golfing and did not cover up his face well enough and sustained a sunburn of his lower lip to the blistered.  He states it has been 3 weeks of discomfort.  He has been using Chapstick.  He states the initial swelling has resolved.  He reports this has happened to him in the past where he gets some blood on his lips.  He does wear sunscreen SPF 71 and uses Chapstick with sunscreen typically.      01/29/2022    9:17 AM 01/24/2021    9:37 AM 09/12/2020   10:38 AM 01/02/2020    9:37 AM 12/14/2019   11:30 AM  Depression screen PHQ 2/9  Decreased Interest 0 0 0 0 0  Down, Depressed, Hopeless 0 0 0 0 0  PHQ - 2 Score 0 0 0 0 0    No Known Allergies Social History   Social History Narrative   Marital status/children/pets: Divorced.   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, Engineer, agricultural:      -smoke alarm in the home:Yes     - wears seatbelt: Yes         Past Medical History:  Diagnosis Date   Arthritis    History of fainting spells of unknown cause    Hx of cardiovascular stress test    ETT-Myoview (05/2013):  diaph atten, no ischemia, EF 63%; Low Risk   Hyperlipidemia    Migraines    Thyroid disease    Past Surgical History:  Procedure Laterality Date   CATARACT EXTRACTION Right 04/2018   ROTATOR CUFF REPAIR Right    2021   TREATMENT FISTULA  ANAL     Family History  Problem Relation Age of Onset   73 / Stillbirths Mother    Cancer Mother    Lung cancer Mother    Hypertension Father    Hyperlipidemia Father    Heart disease Father    Early death Father    Heart attack Father    High Cholesterol Father    Arthritis Sister    Diabetes Sister    Hypertension Sister    Hyperlipidemia Sister    Arthritis Sister    Cancer Maternal Grandmother    Hyperlipidemia Maternal Grandfather    Heart disease Paternal Grandmother    Heart attack Paternal Grandmother    Cancer Paternal Grandfather    Colon cancer Neg Hx    Allergies as of 03/12/2022   No Known Allergies      Medication List        Accurate as of March 12, 2022  4:41 PM. If you have any questions, ask your nurse or doctor.          aspirin EC 81 MG tablet Take 1 tablet (81 mg total) by mouth  daily. Swallow whole.   co-enzyme Q-10 30 MG capsule Take 30 mg by mouth every morning.   FERROUS SULFATE PO Take by mouth.   glucosamine-chondroitin 500-400 MG tablet Take 1 tablet by mouth 2 (two) times daily.   levothyroxine 112 MCG tablet Commonly known as: SYNTHROID Take 1 tablet (112 mcg total) by mouth daily before breakfast.   multivitamin with minerals tablet Take 1 tablet by mouth every morning.   rosuvastatin 20 MG tablet Commonly known as: CRESTOR Take 1 tablet (20 mg total) by mouth daily.   tamsulosin 0.4 MG Caps capsule Commonly known as: FLOMAX Take 1 capsule (0.4 mg total) by mouth 2 (two) times daily.   TURMERIC PO Take by mouth.        All past medical history, surgical history, allergies, family history, immunizations andmedications were updated in the EMR today and reviewed under the history and medication portions of their EMR.     ROS Negative, with the exception of above mentioned in HPI   Objective:  BP 117/62   Pulse 66   Temp 97.8 F (36.6 C)   Wt 181 lb 3.2 oz (82.2 kg)   SpO2 97%   BMI 28.81  kg/m  Body mass index is 28.81 kg/m. Physical Exam Vitals and nursing note reviewed.  Constitutional:      General: He is not in acute distress.    Appearance: Normal appearance. He is not ill-appearing, toxic-appearing or diaphoretic.  HENT:     Head: Normocephalic and atraumatic.     Mouth/Throat:     Lips: Lesions present.      Comments: No swelling.  No erythema.  Small linear area of ulcerations present on lower lip.  No bleeding or drainage. Eyes:     General: No scleral icterus.       Right eye: No discharge.        Left eye: No discharge.     Extraocular Movements: Extraocular movements intact.     Pupils: Pupils are equal, round, and reactive to light.  Skin:    General: Skin is warm and dry.     Coloration: Skin is not jaundiced or pale.     Findings: No rash.  Neurological:     Mental Status: He is alert and oriented to person, place, and time. Mental status is at baseline.  Psychiatric:        Mood and Affect: Mood normal.        Behavior: Behavior normal.        Thought Content: Thought content normal.        Judgment: Judgment normal.      No results found. No results found. No results found for this or any previous visit (from the past 24 hour(s)).  Assessment/Plan: Gary Willis is a 82 y.o. male present for OV for  Lip ulcer Encouraged BB ointment multiple times a day, cool compresses He has some excoriations/ulcerations of his lower lip present today.  Uncertain if original presentation was more of cold sores secondary to sun or sunburn.  He describes it as a sunburn. We discussed protecting his face from the sun when golfing.  May need to consider face protection in addition to hat/scalp protection.  Patient also states he would eventually like to have the warty lesion on the side of his neck removed.  It gets caught on his close. Ok to schedule in a procedure slot for removal of warty lession left neck if desires . Reviewed expectations re:  course of current medical issues. Discussed self-management of symptoms. Outlined signs and symptoms indicating need for more acute intervention. Patient verbalized understanding and all questions were answered. Patient received an After-Visit Summary.    No orders of the defined types were placed in this encounter.  No orders of the defined types were placed in this encounter.  Referral Orders  No referral(s) requested today     Note is dictated utilizing voice recognition software. Although note has been proof read prior to signing, occasional typographical errors still can be missed. If any questions arise, please do not hesitate to call for verification.   electronically signed by:  Howard Pouch, DO  [place]

## 2022-03-21 ENCOUNTER — Ambulatory Visit (INDEPENDENT_AMBULATORY_CARE_PROVIDER_SITE_OTHER): Payer: Medicare PPO

## 2022-03-21 ENCOUNTER — Ambulatory Visit: Payer: Medicare PPO | Attending: Internal Medicine | Admitting: Internal Medicine

## 2022-03-21 ENCOUNTER — Encounter: Payer: Self-pay | Admitting: Internal Medicine

## 2022-03-21 ENCOUNTER — Ambulatory Visit: Payer: Medicare PPO

## 2022-03-21 VITALS — BP 135/70 | HR 62 | Resp 16 | Ht 67.0 in | Wt 181.0 lb

## 2022-03-21 DIAGNOSIS — M19042 Primary osteoarthritis, left hand: Secondary | ICD-10-CM

## 2022-03-21 DIAGNOSIS — M19041 Primary osteoarthritis, right hand: Secondary | ICD-10-CM

## 2022-03-21 DIAGNOSIS — M2550 Pain in unspecified joint: Secondary | ICD-10-CM
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
## 2022-05-19 ENCOUNTER — Ambulatory Visit: Payer: Medicare PPO | Admitting: Family Medicine

## 2022-05-20 ENCOUNTER — Ambulatory Visit: Payer: Medicare PPO | Admitting: Family Medicine

## 2022-05-20 ENCOUNTER — Encounter: Payer: Self-pay | Admitting: Family Medicine

## 2022-05-20 VITALS — BP 132/72 | HR 61 | Temp 97.4°F | Wt 177.2 lb

## 2022-05-20 DIAGNOSIS — N401 Enlarged prostate with lower urinary tract symptoms: Secondary | ICD-10-CM | POA: Diagnosis not present

## 2022-05-20 DIAGNOSIS — E063 Autoimmune thyroiditis: Secondary | ICD-10-CM | POA: Diagnosis not present

## 2022-05-20 DIAGNOSIS — M1990 Unspecified osteoarthritis, unspecified site: Secondary | ICD-10-CM

## 2022-05-20 DIAGNOSIS — E611 Iron deficiency: Secondary | ICD-10-CM | POA: Diagnosis not present

## 2022-05-20 DIAGNOSIS — R972 Elevated prostate specific antigen [PSA]: Secondary | ICD-10-CM | POA: Diagnosis not present

## 2022-05-20 DIAGNOSIS — E038 Other specified hypothyroidism: Secondary | ICD-10-CM | POA: Diagnosis not present

## 2022-05-20 DIAGNOSIS — R3916 Straining to void: Secondary | ICD-10-CM | POA: Diagnosis not present

## 2022-05-20 DIAGNOSIS — I700 Atherosclerosis of aorta: Secondary | ICD-10-CM | POA: Diagnosis not present

## 2022-05-20 DIAGNOSIS — I779 Disorder of arteries and arterioles, unspecified: Secondary | ICD-10-CM

## 2022-05-20 DIAGNOSIS — E785 Hyperlipidemia, unspecified: Secondary | ICD-10-CM

## 2022-05-20 MED ORDER — TAMSULOSIN HCL 0.4 MG PO CAPS: 0.4000 mg | ORAL_CAPSULE | Freq: Two times a day (BID) | ORAL | 3 refills | Status: DC

## 2022-05-20 NOTE — Patient Instructions (Signed)
No follow-ups on file.        Great to see you today.  I have refilled the medication(s) we provide.   If labs were collected, we will inform you of lab results once received either by echart message or telephone call.   - echart message- for normal results that have been seen by the patient already.   - telephone call: abnormal results or if patient has not viewed results in their echart.  

## 2022-05-20 NOTE — Progress Notes (Signed)
Patient ID: Gary Willis, male  DOB: [birth year], 82 y.o.   MRN: [id number] Patient Care Team    Relationship Specialty Notifications Start End  Natalia Leatherwood, DO PCP - General Family Medicine  11/29/21   Jodelle Red, MD PCP - Cardiology Cardiology  01/24/22   Maris Berger, MD Consulting Physician Ophthalmology  11/23/18     Chief Complaint  Patient presents with   Management of Chronic Conditions    [facility]    Subjective: Gary Willis is a 82 y.o.  male present for Chronic Conditions/illness Management  All past medical history, surgical history, allergies, family history, immunizations, medications and social history were updated in the electronic medical record today. All recent labs, ED visits and hospitalizations within the last year were reviewed.  Dyslipidemia Patient has a history of elevated cholesterol.  He is compliant  with Crestor 20 mg daily.  family history of heart disease in his father and paternal grandmother.  He reports he has a history of carotid artery disease.  Last available ultrasound in 2018, showed less than 50% stenosis in the bilateral carotid arteries. Coronary Calcium Score: 468 (12/2021) Percentile: 54 Aortic atherosclerosis/plaque present. hypothyroidism/iron deficiency Compliant w/ levo and iron.  Prior note: Patient reports he has had worsening fatigue over the last 6 months.   He was found to have hypothyroidism and has been treated effectively last thyroid level was normal.  He is compliant with levothyroxine 112 mcg daily. He also has been diagnosed with iron deficiency-without anemia, and has been supplementing OTC ferrous sulfate. He does take a multivitamin. Last colonoscopy completed 06/11/2016-normal, [facility] 5-year follow-up.  Arthritis Likely OA- est rheum. +CCP, other labs normal . Prior note; Reports he has noticed worsening bilateral hand arthritis and swelling.  States is becoming more uncomfortable with  time and is making it more difficult to remain active with his hands.  He has never been tested for types of arthritis in the past.  His sisters both have arthritis, uncertain type.  Benign prostatic hyperplasia (BPH) /elevated PSA Est with uro fir elevated psa- felt to be BPH causes.  Compliant with fosamax BID Prior note: Reports he has had a mildly elevated PSA for decades.  It has gone unchanged.  He does experience BPH symptoms that is improved with Flomax 0.4 mg twice daily.      01/29/2022    9:17 AM 01/24/2021    9:37 AM 09/12/2020   10:38 AM 01/02/2020    9:37 AM 12/14/2019   11:30 AM  Depression screen PHQ 2/9  Decreased Interest 0 0 0 0 0  Down, Depressed, Hopeless 0 0 0 0 0  PHQ - 2 Score 0 0 0 0 0       No data to display                05/20/2022    8:57 AM 01/29/2022    9:17 AM 01/24/2021    9:43 AM 01/02/2020    9:41 AM 11/23/2018   10:31 AM  Fall Risk   Falls in the past year? 0 0 0 0 0  Number falls in past yr: 0 0 0 0   Injury with Fall? 0 0 0 0 0  Risk for fall due to :  No Fall Risks Impaired vision Impaired vision   Follow up Falls evaluation completed Falls evaluation completed Falls prevention discussed Falls prevention discussed Falls evaluation completed;Education provided;Falls prevention discussed    Immunization History  Administered  Date(s) Administered   Fluad Quad(high Dose 65+) 09/14/2018, 10/14/2019, 11/05/2021   Influenza-Unspecified 10/13/2011, 11/08/2013, 12/06/2014, 10/02/2015, 11/27/2016, 09/24/2017, 10/14/2017, 11/12/2020   PFIZER(Purple Top)SARS-COV-2 Vaccination 02/18/2019, 03/15/2019   Pneumococcal Conjugate-13 06/15/2013   Pneumococcal Polysaccharide-23 10/06/2008   Td 10/13/1997, 10/06/2008, 07/22/2019   Zoster Recombinat (Shingrix) 07/22/2019, 12/31/2019   Zoster, Live 09/09/2010    No results found.  Past Medical History:  Diagnosis Date   Arthritis    History of fainting spells of unknown cause    Hx of  cardiovascular stress test    ETT-Myoview (05/2013):  diaph atten, no ischemia, EF 63%; Low Risk   Hyperlipidemia    Migraines    Thyroid disease    No Known Allergies Past Surgical History:  Procedure Laterality Date   CATARACT EXTRACTION Right 04/2018   ROTATOR CUFF REPAIR Right    2021   TREATMENT FISTULA ANAL     Family History  Problem Relation Age of Onset   Miscarriages / Stillbirths Mother    Cancer Mother    Lung cancer Mother    Hypertension Father    Hyperlipidemia Father    Heart disease Father    Early death Father    Heart attack Father    High Cholesterol Father    Arthritis Sister    Diabetes Sister    Hypertension Sister    Hyperlipidemia Sister    Arthritis Sister    Cancer Maternal Grandmother    Hyperlipidemia Maternal Grandfather    Heart disease Paternal Grandmother    Heart attack Paternal Grandmother    Cancer Paternal Grandfather    Colon cancer Neg Hx    Social History   Social History Narrative   Marital status/children/pets: Divorced.   Education/employment: Retired Programmer, systems, Contractor:      -smoke alarm in the home:Yes     - wears seatbelt: Yes          Allergies as of 05/20/2022   No Known Allergies      Medication List        Accurate as of May 20, 2022  9:18 AM. If you have any questions, ask your nurse or doctor.          aspirin EC 81 MG tablet Take 1 tablet (81 mg total) by mouth daily. Swallow whole.   co-enzyme Q-10 30 MG capsule Take 30 mg by mouth every morning.   cyanocobalamin 1000 MCG tablet Commonly known as: VITAMIN B12 Take 1,000 mcg by mouth daily.   FERROUS SULFATE PO Take by mouth.   glucosamine-chondroitin 500-400 MG tablet Take 1 tablet by mouth 2 (two) times daily.   levothyroxine 112 MCG tablet Commonly known as: SYNTHROID Take 1 tablet (112 mcg total) by mouth daily before breakfast.   multivitamin with minerals tablet Take 1 tablet by mouth every morning.    rosuvastatin 20 MG tablet Commonly known as: CRESTOR Take 1 tablet (20 mg total) by mouth daily.   tamsulosin 0.4 MG Caps capsule Commonly known as: FLOMAX Take 1 capsule (0.4 mg total) by mouth 2 (two) times daily.   TURMERIC PO Take by mouth.        All past medical history, surgical history, allergies, family history, immunizations andmedications were updated in the EMR today and reviewed under the history and medication portions of their EMR.    No results found for this or any previous visit (from the past 2160 hour(s)).   ROS 14 pt review of systems performed and negative (unless  mentioned in an HPI)  Objective: BP 132/72   Pulse 61   Temp (!) 97.4 F (36.3 C)   Wt 177 lb 3.2 oz (80.4 kg)   SpO2 96%   BMI 27.75 kg/m  Physical Exam Vitals and nursing note reviewed.  Constitutional:      General: He is not in acute distress.    Appearance: Normal appearance. He is not ill-appearing, toxic-appearing or diaphoretic.  HENT:     Head: Normocephalic and atraumatic.  Eyes:     General: No scleral icterus.       Right eye: No discharge.        Left eye: No discharge.     Extraocular Movements: Extraocular movements intact.     Pupils: Pupils are equal, round, and reactive to light.  Cardiovascular:     Rate and Rhythm: Normal rate and regular rhythm.     Pulses:          Carotid pulses are  on the right side with bruit.    Heart sounds: No murmur heard.    No gallop.  Pulmonary:     Effort: Pulmonary effort is normal. No respiratory distress.     Breath sounds: Normal breath sounds.  Musculoskeletal:     Right lower leg: No edema.     Left lower leg: No edema.  Skin:    General: Skin is warm.     Findings: No rash.  Neurological:     Mental Status: He is alert and oriented to person, place, and time. Mental status is at baseline.     Motor: No weakness.     Gait: Gait normal.  Psychiatric:        Mood and Affect: Mood normal.        Behavior: Behavior  normal.        Thought Content: Thought content normal.        Judgment: Judgment normal.      Assessment/plan: Gary Willis is a 82 y.o. male present for  Elevated PSA - referred to Uro 11/2021- Dr. Kathrynn Running (following yearly-likely BPH cause)  Dyslipidemia Continue Crestor 20 mg. Coronary Calcium Score: 468; Percentile: 54 Aortic atherosclerosis/plaque present.  Hypothyroidism due to Hashimoto's thyroiditis Continue levothyroxine 112 mcg daily Labs due next visit.   Iron deficiency Continue OTC ferrous sulfate daily  Arthritis +CCP, likely OA.  Benign prostatic hyperplasia (BPH) with straining on urination Stable Continue Flomax 0.4 mg twice daily   Return in about 24 weeks (around 11/04/2022) for cpe (20 min), Routine chronic condition follow-up.  No orders of the defined types were placed in this encounter.  Meds ordered this encounter  Medications   tamsulosin (FLOMAX) 0.4 MG CAPS capsule    Sig: Take 1 capsule (0.4 mg total) by mouth 2 (two) times daily.    Dispense:  180 capsule    Refill:  3   Referral Orders  No referral(s) requested today     Note is dictated utilizing voice recognition software. Although note has been proof read prior to signing, occasional typographical errors still can be missed. If any questions arise, please do not hesitate to call for verification.  Electronically signed by: Felix Pacini, DO [facility] [place]

## 2022-11-07 ENCOUNTER — Ambulatory Visit: Payer: Medicare PPO | Admitting: Family Medicine

## 2022-11-07 ENCOUNTER — Encounter: Payer: Self-pay | Admitting: Family Medicine

## 2022-11-07 VITALS — BP 130/68 | HR 63 | Temp 97.8°F | Ht 67.0 in | Wt 179.2 lb

## 2022-11-07 DIAGNOSIS — Z0000 Encounter for general adult medical examination without abnormal findings: Secondary | ICD-10-CM | POA: Diagnosis not present

## 2022-11-07 DIAGNOSIS — R3916 Straining to void: Secondary | ICD-10-CM

## 2022-11-07 DIAGNOSIS — E538 Deficiency of other specified B group vitamins: Secondary | ICD-10-CM | POA: Diagnosis not present

## 2022-11-07 DIAGNOSIS — Z131 Encounter for screening for diabetes mellitus: Secondary | ICD-10-CM

## 2022-11-07 DIAGNOSIS — E063 Autoimmune thyroiditis: Secondary | ICD-10-CM

## 2022-11-07 DIAGNOSIS — E611 Iron deficiency: Secondary | ICD-10-CM | POA: Diagnosis not present

## 2022-11-07 DIAGNOSIS — Z23 Encounter for immunization: Secondary | ICD-10-CM | POA: Diagnosis not present

## 2022-11-07 DIAGNOSIS — R972 Elevated prostate specific antigen [PSA]: Secondary | ICD-10-CM | POA: Diagnosis not present

## 2022-11-07 DIAGNOSIS — N401 Enlarged prostate with lower urinary tract symptoms: Secondary | ICD-10-CM | POA: Diagnosis not present

## 2022-11-07 DIAGNOSIS — E785 Hyperlipidemia, unspecified: Secondary | ICD-10-CM | POA: Diagnosis not present

## 2022-11-07 DIAGNOSIS — I779 Disorder of arteries and arterioles, unspecified: Secondary | ICD-10-CM

## 2022-11-07 DIAGNOSIS — I700 Atherosclerosis of aorta: Secondary | ICD-10-CM

## 2022-11-07 LAB — COMPREHENSIVE METABOLIC PANEL
ALT: 19 U/L (ref 0–53)
AST: 23 U/L (ref 0–37)
Albumin: 4.1 g/dL (ref 3.5–5.2)
Alkaline Phosphatase: 52 U/L (ref 39–117)
BUN: 16 mg/dL (ref 6–23)
CO2: 29 meq/L (ref 19–32)
Calcium: 9.1 mg/dL (ref 8.4–10.5)
Chloride: 105 meq/L (ref 96–112)
Creatinine, Ser: 0.88 mg/dL (ref 0.40–1.50)
GFR: 80.99 mL/min (ref >60.00)
Glucose, Bld: 105 mg/dL — ABNORMAL HIGH (ref 70–99)
Potassium: 4.3 meq/L (ref 3.5–5.1)
Sodium: 141 meq/L (ref 135–145)
Total Bilirubin: 0.5 mg/dL (ref 0.2–1.2)
Total Protein: 7.2 g/dL (ref 6.0–8.3)

## 2022-11-07 LAB — IBC + FERRITIN
Ferritin: 68.5 ng/mL (ref 22.0–322.0)
Iron: 77 ug/dL (ref 42–165)
Saturation Ratios: 23.2 % (ref 20.0–50.0)
TIBC: 331.8 ug/dL (ref 250.0–450.0)
Transferrin: 237 mg/dL (ref 212.0–360.0)

## 2022-11-07 LAB — LIPID PANEL
Cholesterol: 145 mg/dL (ref 0–200)
HDL: 48.6 mg/dL (ref >39.00)
LDL Cholesterol: 71 mg/dL (ref 0–99)
NonHDL: 95.96
Total CHOL/HDL Ratio: 3
Triglycerides: 125 mg/dL (ref 0.0–149.0)
VLDL: 25 mg/dL (ref 0.0–40.0)

## 2022-11-07 LAB — HEMOGLOBIN A1C: Hgb A1c MFr Bld: 5.9 % (ref 4.6–6.5)

## 2022-11-07 LAB — CBC
HCT: 46 % (ref 39.0–52.0)
Hemoglobin: 14.9 g/dL (ref 13.0–17.0)
MCHC: 32.3 g/dL (ref 30.0–36.0)
MCV: 90.3 fL (ref 78.0–100.0)
Platelets: 271 10*3/uL (ref 150.0–400.0)
RBC: 5.1 Mil/uL (ref 4.22–5.81)
RDW: 13.7 % (ref 11.5–15.5)
WBC: 7.4 10*3/uL (ref 4.0–10.5)

## 2022-11-07 LAB — PSA, MEDICARE: PSA: 6.17 ng/mL — ABNORMAL HIGH (ref 0.10–4.00)

## 2022-11-07 LAB — B12 AND FOLATE PANEL
Folate: >24.2 ng/mL (ref >5.9)
Vitamin B-12: 1032 pg/mL — ABNORMAL HIGH (ref 211–911)

## 2022-11-07 LAB — TSH: TSH: 2.59 u[IU]/mL (ref 0.35–5.50)

## 2022-11-07 MED ORDER — ROSUVASTATIN CALCIUM 20 MG PO TABS: 20.0000 mg | ORAL_TABLET | Freq: Every day | ORAL | 3 refills | Status: DC

## 2022-11-07 MED ORDER — TAMSULOSIN HCL 0.4 MG PO CAPS: 0.4000 mg | ORAL_CAPSULE | Freq: Two times a day (BID) | ORAL | 3 refills | Status: DC

## 2022-11-07 NOTE — Progress Notes (Signed)
Patient ID: Gary Willis, male  DOB: 1940-11-04, 82 y.o.   MRN: [id number] Patient Care Team    Relationship Specialty Notifications Start End  Natalia Leatherwood, DO PCP - General Family Medicine  11/29/21   Jodelle Red, MD PCP - Cardiology Cardiology  01/24/22   Maris Berger, MD Consulting Physician Ophthalmology  11/23/18     Chief Complaint  Patient presents with   Annual Exam    Pt is fasting    Subjective: Gary Willis is a 82 y.o.  male present for Chronic Conditions/illness Management  All past medical history, surgical history, allergies, family history, immunizations, medications and social history were updated in the electronic medical record today. All recent labs, ED visits and hospitalizations within the last year were reviewed.  Health maintenance:  Immunizations: tdap UTD 07/2019, influenza vac provided today, shingrix series completed. PNA completed.   Dyslipidemia Patient has a history of elevated cholesterol.  He compliant   with Crestor 20 mg daily.  family history of heart disease in his father and paternal grandmother.  He reports he has a history of carotid artery disease.  Last available ultrasound in 2018, showed less than 50% stenosis in the bilateral carotid arteries. Coronary Calcium Score: 468 (12/2021) Percentile: 54 Aortic atherosclerosis/plaque present.  hypothyroidism/iron deficiency Compliant  w/ levo and iron.  Prior note: Patient reports he has had worsening fatigue over the last 6 months.   He was found to have hypothyroidism and has been treated effectively last thyroid level was normal.  He is compliant with levothyroxine 112 mcg daily. He also has been diagnosed with iron deficiency-without anemia, and has been supplementing OTC ferrous sulfate. He does take a multivitamin. Last colonoscopy completed 06/11/2016-normal, [facility] 5-year follow-up.  Arthritis Likely OA- est rheum. +CCP, other labs normal . Prior  note; Reports he has noticed worsening bilateral hand arthritis and swelling.  States is becoming more uncomfortable with time and is making it more difficult to remain active with his hands.  He has never been tested for types of arthritis in the past.  His sisters both have arthritis, uncertain type.  Benign prostatic hyperplasia (BPH) /elevated PSA Est with uro fir elevated psa- felt to be BPH causes.  Compliant  with fosamax BID Prior note: Reports he has had a mildly elevated PSA for decades.  It has gone unchanged.  He does experience BPH symptoms that is improved with Flomax 0.4 mg twice daily.      01/29/2022    9:17 AM 01/24/2021    9:37 AM 09/12/2020   10:38 AM 01/02/2020    9:37 AM 12/14/2019   11:30 AM  Depression screen PHQ 2/9  Decreased Interest 0 0 0 0 0  Down, Depressed, Hopeless 0 0 0 0 0  PHQ - 2 Score 0 0 0 0 0       No data to display                05/20/2022    8:57 AM 01/29/2022    9:17 AM 01/24/2021    9:43 AM 01/02/2020    9:41 AM 11/23/2018   10:31 AM  Fall Risk   Falls in the past year? 0 0 0 0 0  Number falls in past yr: 0 0 0 0   Injury with Fall? 0 0 0 0 0  Risk for fall due to :  No Fall Risks Impaired vision Impaired vision   Follow up Falls evaluation completed Falls  evaluation completed Falls prevention discussed Falls prevention discussed Falls evaluation completed;Education provided;Falls prevention discussed    Immunization History  Administered Date(s) Administered   Fluad Quad(high Dose 65+) 09/14/2018, 10/14/2019, 11/05/2021   Fluad Trivalent(High Dose 65+) 11/07/2022   Influenza-Unspecified 10/13/2011, 11/08/2013, 12/06/2014, 10/02/2015, 11/27/2016, 09/24/2017, 10/14/2017, 11/12/2020   PFIZER(Purple Top)SARS-COV-2 Vaccination 02/18/2019, 03/15/2019   Pneumococcal Conjugate-13 06/15/2013   Pneumococcal Polysaccharide-23 10/06/2008   Td 10/13/1997, 10/06/2008, 07/22/2019   Zoster Recombinant(Shingrix) 07/22/2019, 12/31/2019    Zoster, Live 09/09/2010    No results found.  Past Medical History:  Diagnosis Date   Arthritis    History of fainting spells of unknown cause    Hx of cardiovascular stress test    ETT-Myoview (05/2013):  diaph atten, no ischemia, EF 63%; Low Risk   Hyperlipidemia    Migraines    Thyroid disease    No Known Allergies Past Surgical History:  Procedure Laterality Date   CATARACT EXTRACTION Right 04/2018   ROTATOR CUFF REPAIR Right    2021   TREATMENT FISTULA ANAL     Family History  Problem Relation Age of Onset   Miscarriages / Stillbirths Mother    Cancer Mother    Lung cancer Mother    Hypertension Father    Hyperlipidemia Father    Heart disease Father    Early death Father    Heart attack Father    High Cholesterol Father    Arthritis Sister    Diabetes Sister    Hypertension Sister    Hyperlipidemia Sister    Arthritis Sister    Cancer Maternal Grandmother    Hyperlipidemia Maternal Grandfather    Heart disease Paternal Grandmother    Heart attack Paternal Grandmother    Cancer Paternal Grandfather    Colon cancer Neg Hx    Social History   Social History Narrative   Marital status/children/pets: Divorced.   Education/employment: Retired Programmer, systems, Contractor:      -smoke alarm in the home:Yes     - wears seatbelt: Yes          Allergies as of 11/07/2022   No Known Allergies      Medication List        Accurate as of November 07, 2022 11:45 AM. If you have any questions, ask your nurse or doctor.          aspirin EC 81 MG tablet Take 1 tablet (81 mg total) by mouth daily. Swallow whole.   co-enzyme Q-10 30 MG capsule Take 30 mg by mouth every morning.   cyanocobalamin 1000 MCG tablet Commonly known as: VITAMIN B12 Take 1,000 mcg by mouth daily.   FERROUS SULFATE PO Take by mouth.   glucosamine-chondroitin 500-400 MG tablet Take 1 tablet by mouth 2 (two) times daily.   levothyroxine 112 MCG tablet Commonly known  as: SYNTHROID Take 1 tablet (112 mcg total) by mouth daily before breakfast.   multivitamin with minerals tablet Take 1 tablet by mouth every morning.   rosuvastatin 20 MG tablet Commonly known as: CRESTOR Take 1 tablet (20 mg total) by mouth daily.   tamsulosin 0.4 MG Caps capsule Commonly known as: FLOMAX Take 1 capsule (0.4 mg total) by mouth 2 (two) times daily.   TURMERIC PO Take by mouth.        All past medical history, surgical history, allergies, family history, immunizations andmedications were updated in the EMR today and reviewed under the history and medication portions of their EMR.  No results found for this or any previous visit (from the past 2160 hour(s)).   ROS 14 pt review of systems performed and negative (unless mentioned in an HPI)  Objective: BP 130/68   Pulse 63   Temp 97.8 F (36.6 C)   Ht 5\' 7"  (1.702 m)   Wt 179 lb 3.2 oz (81.3 kg)   SpO2 98%   BMI 28.07 kg/m  Physical Exam Constitutional:      General: He is not in acute distress.    Appearance: Normal appearance. He is not ill-appearing, toxic-appearing or diaphoretic.  HENT:     Head: Normocephalic and atraumatic.     Right Ear: Tympanic membrane, ear canal and external ear normal. There is no impacted cerumen.     Left Ear: Tympanic membrane, ear canal and external ear normal. There is no impacted cerumen.     Nose: Nose normal. No congestion or rhinorrhea.     Mouth/Throat:     Mouth: Mucous membranes are moist.     Pharynx: Oropharynx is clear. No oropharyngeal exudate or posterior oropharyngeal erythema.  Eyes:     General: No scleral icterus.       Right eye: No discharge.        Left eye: No discharge.     Extraocular Movements: Extraocular movements intact.     Pupils: Pupils are equal, round, and reactive to light.  Cardiovascular:     Rate and Rhythm: Normal rate and regular rhythm.     Pulses: Normal pulses.     Heart sounds: Normal heart sounds. No murmur heard.     No friction rub. No gallop.  Pulmonary:     Effort: Pulmonary effort is normal. No respiratory distress.     Breath sounds: Normal breath sounds. No stridor. No wheezing, rhonchi or rales.  Chest:     Chest wall: No tenderness.  Abdominal:     General: Abdomen is flat. Bowel sounds are normal. There is no distension.     Palpations: Abdomen is soft. There is no mass.     Tenderness: There is no abdominal tenderness. There is no right CVA tenderness, left CVA tenderness, guarding or rebound.     Hernia: No hernia is present.  Musculoskeletal:        General: No swelling or tenderness. Normal range of motion.     Cervical back: Normal range of motion and neck supple.     Right lower leg: No edema.     Left lower leg: No edema.  Lymphadenopathy:     Cervical: No cervical adenopathy.  Skin:    General: Skin is warm and dry.     Coloration: Skin is not jaundiced.     Findings: No bruising, lesion or rash.  Neurological:     General: No focal deficit present.     Mental Status: He is alert and oriented to person, place, and time. Mental status is at baseline.     Cranial Nerves: No cranial nerve deficit.     Sensory: No sensory deficit.     Motor: No weakness.     Coordination: Coordination normal.     Gait: Gait normal.     Deep Tendon Reflexes: Reflexes normal.  Psychiatric:        Mood and Affect: Mood normal.        Behavior: Behavior normal.        Thought Content: Thought content normal.        Judgment: Judgment normal.  Assessment/plan: Gary Willis is a 82 y.o. male present for annual cpe and Chronic Conditions/illness Management Elevated PSA - referred to Uro 11/2021- Dr. Kathrynn Running (following yearly-likely BPH cause) - psa collected today Dyslipidemia/Aortic atherosclerosis (HCC)/Carotid artery disease, unspecified laterality, unspecified type (HCC) Continue  Crestor 20 mg. Coronary Calcium Score: 468; Percentile: 54 Aortic atherosclerosis/plaque present. Est  cardiology Labs collected today  Hypothyroidism due to Hashimoto's thyroiditis Continue levothyroxine 112 mcg daily. refills will be provided in appropriate dose based on lab result today TSH collected today  Iron deficiency Continue OTC ferrous sulfate daily Iron panel collected today  Arthritis +CCP, likely OA.  Benign prostatic hyperplasia (BPH) with straining on urination Stable continue Flomax 0.4 mg twice daily  Influenza vaccine needed - Flu Vaccine Trivalent High Dose (Fluad)  Iron deficiency - IBC + Ferritin  Diabetes mellitus screening - Hemoglobin A1c B12 deficiency - B12 and Folate Panel  Routine general medical examination at a health care facility - Hemoglobin A1c Patient was encouraged to exercise greater than 150 minutes a week. Patient was encouraged to choose a diet filled with fresh fruits and vegetables, and lean meats. AVS provided to patient today for education/recommendation on gender specific health and safety maintenance. Immunizations: tdap UTD 07/2019, influenza vac provided today, shingrix series completed. PNA completed.   Return in about 24 weeks (around 04/24/2023) for Routine chronic condition follow-up.  Orders Placed This Encounter  Procedures   Flu Vaccine Trivalent High Dose (Fluad)   CBC   PSA, Medicare   TSH   Lipid panel   Comprehensive metabolic panel   Hemoglobin A1c   IBC + Ferritin   B12 and Folate Panel   Meds ordered this encounter  Medications   rosuvastatin (CRESTOR) 20 MG tablet    Sig: Take 1 tablet (20 mg total) by mouth daily.    Dispense:  90 tablet    Refill:  3   tamsulosin (FLOMAX) 0.4 MG CAPS capsule    Sig: Take 1 capsule (0.4 mg total) by mouth 2 (two) times daily.    Dispense:  180 capsule    Refill:  3   Referral Orders  No referral(s) requested today     Note is dictated utilizing voice recognition software. Although note has been proof read prior to signing, occasional typographical errors  still can be missed. If any questions arise, please do not hesitate to call for verification.  Electronically signed by: Felix Pacini, DO [facility] [place]

## 2022-11-07 NOTE — Patient Instructions (Addendum)

## 2022-11-10 ENCOUNTER — Other Ambulatory Visit: Payer: Self-pay | Admitting: Family Medicine

## 2022-11-10 MED ORDER — LEVOTHYROXINE SODIUM 112 MCG PO TABS: 112.0000 ug | ORAL_TABLET | Freq: Every day | ORAL | 3 refills | Status: DC

## 2023-02-04 ENCOUNTER — Ambulatory Visit: Payer: Medicare PPO | Admitting: *Deleted

## 2023-02-04 DIAGNOSIS — Z0000 Encounter for general adult medical examination without abnormal findings: Secondary | ICD-10-CM | POA: Diagnosis not present

## 2023-02-04 NOTE — Patient Instructions (Signed)
Gary Willis , Thank you for taking time to come for your Medicare Wellness Visit. I appreciate your ongoing commitment to your health goals. Please review the following plan we discussed and let me know if I can assist you in the future.   Screening recommendations/referrals: Colonoscopy: no longer required Recommended yearly ophthalmology/optometry visit for glaucoma screening and checkup Recommended yearly dental visit for hygiene and checkup  Vaccinations: Influenza vaccine: up to date Pneumococcal vaccine: up to date Tdap vaccine: up to date Shingles vaccine: up to date    Advanced directives: yes not on file   Preventive Care 65 Years and Older, Male Preventive care refers to lifestyle choices and visits with your health care provider that can promote health and wellness. What does preventive care include? A yearly physical exam. This is also called an annual well check. Dental exams once or twice a year. Routine eye exams. Ask your health care provider how often you should have your eyes checked. Personal lifestyle choices, including: Daily care of your teeth and gums. Regular physical activity. Eating a healthy diet. Avoiding tobacco and drug use. Limiting alcohol use. Practicing safe sex. Taking low doses of aspirin every day. Taking vitamin and mineral supplements as recommended by your health care provider. What happens during an annual well check? The services and screenings done by your health care provider during your annual well check will depend on your age, overall health, lifestyle risk factors, and family history of disease. Counseling  Your health care provider may ask you questions about your: Alcohol use. Tobacco use. Drug use. Emotional well-being. Home and relationship well-being. Sexual activity. Eating habits. History of falls. Memory and ability to understand (cognition). Work and work Astronomer. Screening  You may have the following tests  or measurements: Height, weight, and BMI. Blood pressure. Lipid and cholesterol levels. These may be checked every 5 years, or more frequently if you are over 2 years old. Skin check. Lung cancer screening. You may have this screening every year starting at age 82 if you have a 30-pack-year history of smoking and currently smoke or have quit within the past 15 years. Fecal occult blood test (FOBT) of the stool. You may have this test every year starting at age 82. Flexible sigmoidoscopy or colonoscopy. You may have a sigmoidoscopy every 5 years or a colonoscopy every 10 years starting at age 82. Prostate cancer screening. Recommendations will vary depending on your family history and other risks. Hepatitis C blood test. Hepatitis B blood test. Sexually transmitted disease (STD) testing. Diabetes screening. This is done by checking your blood sugar (glucose) after you have not eaten for a while (fasting). You may have this done every 1-3 years. Abdominal aortic aneurysm (AAA) screening. You may need this if you are a current or former smoker. Osteoporosis. You may be screened starting at age 82 if you are at high risk. Talk with your health care provider about your test results, treatment options, and if necessary, the need for more tests. Vaccines  Your health care provider may recommend certain vaccines, such as: Influenza vaccine. This is recommended every year. Tetanus, diphtheria, and acellular pertussis (Tdap, Td) vaccine. You may need a Td booster every 10 years. Zoster vaccine. You may need this after age 82. Pneumococcal 13-valent conjugate (PCV13) vaccine. One dose is recommended after age 50. Pneumococcal polysaccharide (PPSV23) vaccine. One dose is recommended after age 82. Talk to your health care provider about which screenings and vaccines you need and how often you need  them. This information is not intended to replace advice given to you by your health care provider. Make  sure you discuss any questions you have with your health care provider. Document Released: 01/26/2015 Document Revised: 09/19/2015 Document Reviewed: 10/31/2014 Elsevier Interactive Patient Education  2017 [employer].  Fall Prevention in the Home Falls can cause injuries. They can happen to people of all ages. There are many things you can do to make your home safe and to help prevent falls. What can I do on the outside of my home? Regularly fix the edges of walkways and driveways and fix any cracks. Remove anything that might make you trip as you walk through a door, such as a raised step or threshold. Trim any bushes or trees on the path to your home. Use bright outdoor lighting. Clear any walking paths of anything that might make someone trip, such as rocks or tools. Regularly check to see if handrails are loose or broken. Make sure that both sides of any steps have handrails. Any raised decks and porches should have guardrails on the edges. Have any leaves, snow, or ice cleared regularly. Use sand or salt on walking paths during winter. Clean up any spills in your garage right away. This includes oil or grease spills. What can I do in the bathroom? Use night lights. Install grab bars by the toilet and in the tub and shower. Do not use towel bars as grab bars. Use non-skid mats or decals in the tub or shower. If you need to sit down in the shower, use a plastic, non-slip stool. Keep the floor dry. Clean up any water that spills on the floor as soon as it happens. Remove soap buildup in the tub or shower regularly. Attach bath mats securely with double-sided non-slip rug tape. Do not have throw rugs and other things on the floor that can make you trip. What can I do in the bedroom? Use night lights. Make sure that you have a light by your bed that is easy to reach. Do not use any sheets or blankets that are too big for your bed. They should not hang down onto the floor. Have a firm  chair that has side arms. You can use this for support while you get dressed. Do not have throw rugs and other things on the floor that can make you trip. What can I do in the kitchen? Clean up any spills right away. Avoid walking on wet floors. Keep items that you use a lot in easy-to-reach places. If you need to reach something above you, use a strong step stool that has a grab bar. Keep electrical cords out of the way. Do not use floor polish or wax that makes floors slippery. If you must use wax, use non-skid floor wax. Do not have throw rugs and other things on the floor that can make you trip. What can I do with my stairs? Do not leave any items on the stairs. Make sure that there are handrails on both sides of the stairs and use them. Fix handrails that are broken or loose. Make sure that handrails are as long as the stairways. Check any carpeting to make sure that it is firmly attached to the stairs. Fix any carpet that is loose or worn. Avoid having throw rugs at the top or bottom of the stairs. If you do have throw rugs, attach them to the floor with carpet tape. Make sure that you have a light switch at  the top of the stairs and the bottom of the stairs. If you do not have them, ask someone to add them for you. What else can I do to help prevent falls? Wear shoes that: Do not have high heels. Have rubber bottoms. Are comfortable and fit you well. Are closed at the toe. Do not wear sandals. If you use a stepladder: Make sure that it is fully opened. Do not climb a closed stepladder. Make sure that both sides of the stepladder are locked into place. Ask someone to hold it for you, if possible. Clearly mark and make sure that you can see: Any grab bars or handrails. First and last steps. Where the edge of each step is. Use tools that help you move around (mobility aids) if they are needed. These include: Canes. Walkers. Scooters. Crutches. Turn on the lights when you go  into a dark area. Replace any light bulbs as soon as they burn out. Set up your furniture so you have a clear path. Avoid moving your furniture around. If any of your floors are uneven, fix them. If there are any pets around you, be aware of where they are. Review your medicines with your doctor. Some medicines can make you feel dizzy. This can increase your chance of falling. Ask your doctor what other things that you can do to help prevent falls. This information is not intended to replace advice given to you by your health care provider. Make sure you discuss any questions you have with your health care provider. Document Released: 10/26/2008 Document Revised: 06/07/2015 Document Reviewed: 02/03/2014 Elsevier Interactive Patient Education  2017 [employer].

## 2023-02-04 NOTE — Progress Notes (Signed)
Subjective:   Gary Willis is a 83 y.o. male who presents for Medicare Annual/Subsequent preventive examination.  Visit Complete: Virtual I connected with  Ori R Deakin on 02/04/23 by a audio enabled telemedicine application and verified that I am speaking with the correct person using two identifiers.  Patient Location: Home  Provider Location: Home Office  I discussed the limitations of evaluation and management by telemedicine. The patient expressed understanding and agreed to proceed.  Vital Signs: Because this visit was a virtual/telehealth visit, some criteria may be missing or patient reported. Any vitals not documented were not able to be obtained and vitals that have been documented are patient reported.  Not able to connect to video  Cardiac Risk Factors include: advanced age (>68men, >59 women);male gender;family history of premature cardiovascular disease     Objective:    There were no vitals filed for this visit. There is no height or weight on file to calculate BMI.     02/04/2023   10:57 AM 01/29/2022    9:19 AM 02/07/2021   11:06 AM 01/24/2021    9:40 AM 01/02/2020    9:39 AM 11/23/2018   10:30 AM  Advanced Directives  Does Patient Have a Medical Advance Directive? Yes No Yes Yes Yes No  Type of Advance Directive Healthcare Power of Attorney    Living will   Does patient want to make changes to medical advance directive?    Yes (MAU/Ambulatory/Procedural Areas - Information given)    Copy of Healthcare Power of Attorney in Chart? No - copy requested       Would patient like information on creating a medical advance directive?  No - Patient declined    Yes (MAU/Ambulatory/Procedural Areas - Information given)    Current Medications (verified) Outpatient Encounter Medications as of 02/04/2023  Medication Sig   aspirin EC 81 MG tablet Take 1 tablet (81 mg total) by mouth daily. Swallow whole.   co-enzyme Q-10 30 MG capsule Take 30 mg by mouth every  morning.   cyanocobalamin (VITAMIN B12) 1000 MCG tablet Take 1,000 mcg by mouth daily.   glucosamine-chondroitin 500-400 MG tablet Take 1 tablet by mouth 2 (two) times daily.   levothyroxine (SYNTHROID) 112 MCG tablet Take 1 tablet (112 mcg total) by mouth daily before breakfast.   Multiple Vitamins-Minerals (MULTIVITAMIN WITH MINERALS) tablet Take 1 tablet by mouth every morning.   rosuvastatin (CRESTOR) 20 MG tablet Take 1 tablet (20 mg total) by mouth daily.   tamsulosin (FLOMAX) 0.4 MG CAPS capsule Take 1 capsule (0.4 mg total) by mouth 2 (two) times daily.   TURMERIC PO Take by mouth.   FERROUS SULFATE PO Take by mouth.   No facility-administered encounter medications on file as of 02/04/2023.    Allergies (verified) Patient has no known allergies.   History: Past Medical History:  Diagnosis Date   Arthritis    History of fainting spells of unknown cause    Hx of cardiovascular stress test    ETT-Myoview (05/2013):  diaph atten, no ischemia, EF 63%; Low Risk   Hyperlipidemia    Migraines    Thyroid disease    Past Surgical History:  Procedure Laterality Date   CATARACT EXTRACTION Right 04/2018   ROTATOR CUFF REPAIR Right    2021   TREATMENT FISTULA ANAL     Family History  Problem Relation Age of Onset   Miscarriages / Stillbirths Mother    Cancer Mother    Lung cancer Mother  Hypertension Father    Hyperlipidemia Father    Heart disease Father    Early death Father    Heart attack Father    High Cholesterol Father    Arthritis Sister    Diabetes Sister    Hypertension Sister    Hyperlipidemia Sister    Arthritis Sister    Cancer Maternal Grandmother    Hyperlipidemia Maternal Grandfather    Heart disease Paternal Grandmother    Heart attack Paternal Grandmother    Cancer Paternal Grandfather    Colon cancer Neg Hx    Social History   Socioeconomic History   Marital status: Divorced    Spouse name: Not on file   Number of children: Not on file    Years of education: Not on file   Highest education level: Master's degree (e.g., MA, MS, MEng, MEd, MSW, MBA)  Occupational History   Occupation: retired  Tobacco Use   Smoking status: Never    Passive exposure: Never   Smokeless tobacco: Former    Types: Engineer, drilling   Vaping status: Never Used  Substance and Sexual Activity   Alcohol use: Yes    Alcohol/week: 1.0 standard drink of alcohol    Types: 1 Cans of beer per week    Comment: social   Drug use: No   Sexual activity: Not Currently    Partners: Female  Other Topics Concern   Not on file  Social History Narrative   Marital status/children/pets: Divorced.   Education/employment: Retired Programmer, systems, Contractor:      -smoke alarm in the home:Yes     - wears seatbelt: Yes         Social Drivers of Corporate investment banker Strain: Low Risk  (02/04/2023)   Overall Financial Resource Strain (CARDIA)    Difficulty of Paying Living Expenses: Not hard at all  Food Insecurity: No Food Insecurity (02/04/2023)   Hunger Vital Sign    Worried About Running Out of Food in the Last Year: Never true    Ran Out of Food in the Last Year: Never true  Transportation Needs: No Transportation Needs (02/03/2023)   PRAPARE - Administrator, Civil Service (Medical): No    Lack of Transportation (Non-Medical): No  Physical Activity: Sufficiently Active (02/04/2023)   Exercise Vital Sign    Days of Exercise per Week: 5 days    Minutes of Exercise per Session: 40 min  Stress: No Stress Concern Present (02/04/2023)   [employer] of Occupational Health - Occupational Stress Questionnaire    Feeling of Stress : Not at all  Social Connections: Moderately Isolated (02/04/2023)   Social Connection and Isolation Panel [NHANES]    Frequency of Communication with Friends and Family: More than three times a week    Frequency of Social Gatherings with Friends and Family: More than three times a week    Attends  Religious Services: Never    Database administrator or Organizations: Yes    Attends Engineer, structural: More than 4 times per year    Marital Status: Divorced    Tobacco Counseling Counseling given: Not Answered   Clinical Intake:  Pre-visit preparation completed: Yes  Pain : No/denies pain     Diabetes: No  How often do you need to have someone help you when you read instructions, pamphlets, or other written materials from your doctor or pharmacy?: 1 - Never  Interpreter Needed?: No  Information entered  by :Remi Haggard LPN   Activities of Daily Living    02/04/2023   10:59 AM  In your present state of health, do you have any difficulty performing the following activities:  Hearing? 1  Vision? 0  Difficulty concentrating or making decisions? 0  Walking or climbing stairs? 0  Dressing or bathing? 0  Doing errands, shopping? 0  Preparing Food and eating ? N  Using the Toilet? N  In the past six months, have you accidently leaked urine? N  Do you have problems with loss of bowel control? N  Managing your Medications? N  Managing your Finances? N  Housekeeping or managing your Housekeeping? N    Patient Care Team: Natalia Leatherwood, DO as PCP - General (Family Medicine) Jodelle Red, MD as PCP - Cardiology (Cardiology) Maris Berger, MD as Consulting Physician (Ophthalmology)  Indicate any recent Medical Services you may have received from other than [facility] providers in the past year (date may be approximate).     Assessment:   This is a routine wellness examination for Gary Willis.  Hearing/Vision screen Hearing Screening - Comments:: Some trouble hearing Does not wear hearing aids Vision Screening - Comments:: Up to date [facility] Has had cataract surgery on both eyes   Goals Addressed             This Visit's Progress    Patient Stated   On track    Remain healthy and keep exercising      Patient Stated    On track    Back with silver sneak      Patient Stated   On track    Maintain current lifestyle Continue excercise       Depression Screen    02/04/2023   11:04 AM 01/29/2022    9:17 AM 01/24/2021    9:37 AM 09/12/2020   10:38 AM 01/02/2020    9:37 AM 12/14/2019   11:30 AM 11/23/2018   10:31 AM  PHQ 2/9 Scores  PHQ - 2 Score 0 0 0 0 0 0 0  PHQ- 9 Score 0          Fall Risk    02/04/2023   10:59 AM 05/20/2022    8:57 AM 01/29/2022    9:17 AM 01/24/2021    9:43 AM 01/02/2020    9:41 AM  Fall Risk   Falls in the past year? 0 0 0 0 0  Number falls in past yr: 0 0 0 0 0  Injury with Fall? 0 0 0 0 0  Risk for fall due to :   No Fall Risks Impaired vision Impaired vision  Follow up Falls evaluation completed;Education provided;Falls prevention discussed Falls evaluation completed Falls evaluation completed Falls prevention discussed Falls prevention discussed    MEDICARE RISK AT HOME: Medicare Risk at Home If so, are there any without handrails?: No Home free of loose throw rugs in walkways, pet beds, electrical cords, etc?: Yes Adequate lighting in your home to reduce risk of falls?: Yes Life alert?: No Use of a cane, walker or w/c?: No Grab bars in the bathroom?: Yes Shower chair or bench in shower?: Yes Elevated toilet seat or a handicapped toilet?: Yes  TIMED UP AND GO:  Was the test performed?  No    Cognitive Function:        02/04/2023   11:00 AM 01/29/2022    9:22 AM 01/24/2021    9:47 AM 11/23/2018   10:31 AM  6CIT  Screen  What Year? 0 points 0 points 0 points 0 points  What month? 0 points 0 points 0 points 0 points  What time? 0 points 0 points 0 points 0 points  Count back from 20 0 points 0 points 0 points 0 points  Months in reverse 0 points 0 points 0 points 0 points  Repeat phrase 0 points 2 points 0 points 0 points  Total Score 0 points 2 points 0 points 0 points    Immunizations Immunization History  Administered Date(s) Administered   Fluad  Quad(high Dose 65+) 09/14/2018, 10/14/2019, 11/05/2021   Fluad Trivalent(High Dose 65+) 11/07/2022   Influenza-Unspecified 10/13/2011, 11/08/2013, 12/06/2014, 10/02/2015, 11/27/2016, 09/24/2017, 10/14/2017, 11/12/2020   PFIZER(Purple Top)SARS-COV-2 Vaccination 02/18/2019, 03/15/2019   Pneumococcal Conjugate-13 06/15/2013   Pneumococcal Polysaccharide-23 10/06/2008   Td 10/13/1997, 10/06/2008, 07/22/2019   Zoster Recombinant(Shingrix) 07/22/2019, 12/31/2019   Zoster, Live 09/09/2010    TDAP status: Up to date  Flu Vaccine status: Up to date  Pneumococcal vaccine status: Up to date  Covid-19 vaccine status: Information provided on how to obtain vaccines.   Qualifies for Shingles Vaccine? Yes   Zostavax completed Yes   Shingrix Completed?: Yes  Screening Tests Health Maintenance  Topic Date Due   Medicare Annual Wellness (AWV)  02/04/2024   DTaP/Tdap/Td (4 - Tdap) 07/21/2029   Pneumonia Vaccine 59+ Years old  Completed   INFLUENZA VACCINE  Completed   Zoster Vaccines- Shingrix  Completed   HPV VACCINES  Aged Out   COVID-19 Vaccine  Discontinued    Health Maintenance  There are no preventive care reminders to display for this patient.   Colorectal cancer screening: No longer required.   Lung Cancer Screening: (Low Dose CT Chest recommended if Age 62-80 years, 20 pack-year currently smoking OR have quit w/in 15years.) does not qualify.   Lung Cancer Screening Referral:   Additional Screening:  Hepatitis C Screening: does not qualify  Vision Screening: Recommended annual ophthalmology exams for early detection of glaucoma and other disorders of the eye. Is the patient up to date with their annual eye exam?  Yes  Who is the provider or what is the name of the office in which the patient attends annual eye exams? [facility] If pt is not established with a provider, would they like to be referred to a provider to establish care? No .   Dental Screening:  Recommended annual dental exams for proper oral hygiene    Community Resource Referral / Chronic Care Management: CRR required this visit?  No   CCM required this visit?  No     Plan:     I have personally reviewed and noted the following in the patient's chart:   Medical and social history Use of alcohol, tobacco or illicit drugs  Current medications and supplements including opioid prescriptions. Patient is not currently taking opioid prescriptions. Functional ability and status Nutritional status Physical activity Advanced directives List of other physicians Hospitalizations, surgeries, and ER visits in previous 12 months Vitals Screenings to include cognitive, depression, and falls Referrals and appointments  In addition, I have reviewed and discussed with patient certain preventive protocols, quality metrics, and best practice recommendations. A written personalized care plan for preventive services as well as general preventive health recommendations were provided to patient.     Remi Haggard, LPN   [id number]/95/[id number]   After Visit Summary: (MyChart) Due to this being a telephonic visit, the after visit summary with patients personalized plan was offered to patient via  MyChart   Nurse Notes:

## 2023-02-18 DIAGNOSIS — H52203 Unspecified astigmatism, bilateral: Secondary | ICD-10-CM | POA: Diagnosis not present

## 2023-02-18 DIAGNOSIS — Z961 Presence of intraocular lens: Secondary | ICD-10-CM | POA: Diagnosis not present

## 2023-03-20 ENCOUNTER — Ambulatory Visit: Payer: Medicare PPO | Admitting: Internal Medicine

## 2023-04-27 ENCOUNTER — Encounter: Payer: Self-pay | Admitting: Family Medicine

## 2023-04-27 ENCOUNTER — Ambulatory Visit: Payer: Medicare PPO | Admitting: Family Medicine

## 2023-04-27 VITALS — BP 128/62 | HR 77 | Temp 98.1°F | Wt 177.6 lb

## 2023-04-27 DIAGNOSIS — I700 Atherosclerosis of aorta: Secondary | ICD-10-CM | POA: Diagnosis not present

## 2023-04-27 DIAGNOSIS — R7309 Other abnormal glucose: Secondary | ICD-10-CM

## 2023-04-27 DIAGNOSIS — R972 Elevated prostate specific antigen [PSA]: Secondary | ICD-10-CM

## 2023-04-27 DIAGNOSIS — E063 Autoimmune thyroiditis: Secondary | ICD-10-CM | POA: Diagnosis not present

## 2023-04-27 DIAGNOSIS — E611 Iron deficiency: Secondary | ICD-10-CM | POA: Diagnosis not present

## 2023-04-27 DIAGNOSIS — E785 Hyperlipidemia, unspecified: Secondary | ICD-10-CM

## 2023-04-27 DIAGNOSIS — I779 Disorder of arteries and arterioles, unspecified: Secondary | ICD-10-CM

## 2023-04-27 LAB — POCT GLYCOSYLATED HEMOGLOBIN (HGB A1C)
HbA1c POC (<> result, manual entry): 5.5 % (ref 4.0–5.6)
HbA1c, POC (controlled diabetic range): 5.5 % (ref 0.0–7.0)
HbA1c, POC (prediabetic range): 5.5 % — AB (ref 5.7–6.4)
Hemoglobin A1C: 5.5 % (ref 4.0–5.6)

## 2023-04-27 MED ORDER — LEVOTHYROXINE SODIUM 112 MCG PO TABS: 112.0000 ug | ORAL_TABLET | Freq: Every day | ORAL | 3 refills | Status: DC

## 2023-04-27 MED ORDER — ROSUVASTATIN CALCIUM 20 MG PO TABS: 20.0000 mg | ORAL_TABLET | Freq: Every day | ORAL | 3 refills | Status: DC

## 2023-04-27 MED ORDER — TAMSULOSIN HCL 0.4 MG PO CAPS: 0.4000 mg | ORAL_CAPSULE | Freq: Two times a day (BID) | ORAL | 3 refills | Status: DC

## 2023-04-27 NOTE — Progress Notes (Signed)
 Patient ID: Gary Willis, male  DOB: 05-Dec-1940, 83 y.o.   MRN: [id number] Patient Care Team    Relationship Specialty Notifications Start End  Natalia Leatherwood, DO PCP - General Family Medicine  11/29/21   Maris Berger, MD Consulting Physician Ophthalmology  11/23/18   Loletta Parish., MD Consulting Physician Urology  04/27/23   Jodelle Red, MD Consulting Physician Cardiology  04/27/23     Chief Complaint  Patient presents with   Dyslipidemia    Subjective: Gary Willis is a 83 y.o.  male present for Chronic Conditions/illness Management All past medical history, surgical history, allergies, family history, immunizations, medications and social history were updated in the electronic medical record today. All recent labs, ED visits and hospitalizations within the last year were reviewed.  Dyslipidemia Patient has a history of elevated cholesterol.  He is compliant with Crestor 20 mg daily.  family history of heart disease in his father and paternal grandmother.  He reports he has a history of carotid artery disease.  Last available ultrasound in 2018, showed less than 50% stenosis in the bilateral carotid arteries. Coronary Calcium Score: 468 (12/2021) Percentile: 54 Aortic atherosclerosis/plaque present.  hypothyroidism/iron deficiency Compliant  w/ levo and iron.  Prior note: Patient reports he has had worsening fatigue over the last 6 months.   He was found to have hypothyroidism and has been treated effectively last thyroid level was normal.  He is compliant with levothyroxine 112 mcg daily. He also has been diagnosed with iron deficiency-without anemia, and has been supplementing OTC ferrous sulfate. He does take a multivitamin. Last colonoscopy completed 06/11/2016-normal, [facility] 5-year follow-up.  Arthritis Likely OA- est rheum. +CCP, other labs normal . Prior note; Reports he has noticed worsening bilateral hand arthritis and swelling.   States is becoming more uncomfortable with time and is making it more difficult to remain active with his hands.  He has never been tested for types of arthritis in the past.  His sisters both have arthritis, uncertain type.  Benign prostatic hyperplasia (BPH) /elevated PSA Est with uro fir elevated psa- felt to be BPH causes. Dr. Berneice Heinrich  Prior note: Reports he has had a mildly elevated PSA for decades.  It has gone unchanged.  He does experience BPH symptoms that is improved with Flomax 0.4 mg twice daily.      02/04/2023   11:04 AM 01/29/2022    9:17 AM 01/24/2021    9:37 AM 09/12/2020   10:38 AM 01/02/2020    9:37 AM  Depression screen PHQ 2/9  Decreased Interest 0 0 0 0 0  Down, Depressed, Hopeless 0 0 0 0 0  PHQ - 2 Score 0 0 0 0 0  Altered sleeping 0      Tired, decreased energy 0      Change in appetite 0      Feeling bad or failure about yourself  0      Trouble concentrating 0      Moving slowly or fidgety/restless 0      Suicidal thoughts 0      PHQ-9 Score 0      Difficult doing work/chores Not difficult at all           No data to display                02/04/2023   10:59 AM 05/20/2022    8:57 AM 01/29/2022    9:17 AM 01/24/2021  9:43 AM 01/02/2020    9:41 AM  Fall Risk   Falls in the past year? 0 0 0 0 0  Number falls in past yr: 0 0 0 0 0  Injury with Fall? 0 0 0 0 0  Risk for fall due to :   No Fall Risks Impaired vision Impaired vision  Follow up Falls evaluation completed;Education provided;Falls prevention discussed Falls evaluation completed Falls evaluation completed Falls prevention discussed Falls prevention discussed    Immunization History  Administered Date(s) Administered   Fluad Quad(high Dose 65+) 09/14/2018, 10/14/2019, 11/05/2021   Fluad Trivalent(High Dose 65+) 11/07/2022   Influenza-Unspecified 10/13/2011, 11/08/2013, 12/06/2014, 10/02/2015, 11/27/2016, 09/24/2017, 10/14/2017, 11/12/2020   PFIZER(Purple Top)SARS-COV-2 Vaccination  02/18/2019, 03/15/2019   Pneumococcal Conjugate-13 06/15/2013   Pneumococcal Polysaccharide-23 10/06/2008   Td 10/13/1997, 10/06/2008, 07/22/2019   Zoster Recombinant(Shingrix) 07/22/2019, 12/31/2019   Zoster, Live 09/09/2010    No results found.  Past Medical History:  Diagnosis Date   Arthritis    History of fainting spells of unknown cause    Hx of cardiovascular stress test    ETT-Myoview (05/2013):  diaph atten, no ischemia, EF 63%; Low Risk   Hyperlipidemia    Migraines    Thyroid disease    No Known Allergies Past Surgical History:  Procedure Laterality Date   CATARACT EXTRACTION Right 04/2018   ROTATOR CUFF REPAIR Right    2021   TREATMENT FISTULA ANAL     Family History  Problem Relation Age of Onset   Miscarriages / Stillbirths Mother    Cancer Mother    Lung cancer Mother    Hypertension Father    Hyperlipidemia Father    Heart disease Father    Early death Father    Heart attack Father    High Cholesterol Father    Arthritis Sister    Diabetes Sister    Hypertension Sister    Hyperlipidemia Sister    Arthritis Sister    Cancer Maternal Grandmother    Hyperlipidemia Maternal Grandfather    Heart disease Paternal Grandmother    Heart attack Paternal Grandmother    Cancer Paternal Grandfather    Colon cancer Neg Hx    Social History   Social History Narrative   Marital status/children/pets: Divorced.   Education/employment: Retired Programmer, systems, Contractor:      -smoke alarm in the home:Yes     - wears seatbelt: Yes          Allergies as of 04/27/2023   No Known Allergies      Medication List        Accurate as of April 27, 2023  9:08 AM. If you have any questions, ask your nurse or doctor.          aspirin EC 81 MG tablet Take 1 tablet (81 mg total) by mouth daily. Swallow whole.   co-enzyme Q-10 30 MG capsule Take 30 mg by mouth every morning.   cyanocobalamin 1000 MCG tablet Commonly known as: VITAMIN B12 Take  1,000 mcg by mouth daily.   FERROUS SULFATE PO Take by mouth.   glucosamine-chondroitin 500-400 MG tablet Take 1 tablet by mouth 2 (two) times daily.   levothyroxine 112 MCG tablet Commonly known as: SYNTHROID Take 1 tablet (112 mcg total) by mouth daily before breakfast.   multivitamin with minerals tablet Take 1 tablet by mouth every morning.   rosuvastatin 20 MG tablet Commonly known as: CRESTOR Take 1 tablet (20 mg total) by mouth daily.  tamsulosin 0.4 MG Caps capsule Commonly known as: FLOMAX Take 1 capsule (0.4 mg total) by mouth 2 (two) times daily.   TURMERIC PO Take by mouth.        All past medical history, surgical history, allergies, family history, immunizations andmedications were updated in the EMR today and reviewed under the history and medication portions of their EMR.    Recent Results (from the past 2160 hours)  POCT HgB A1C     Status: Abnormal   Collection Time: 04/27/23  9:05 AM  Result Value Ref Range   Hemoglobin A1C 5.5 4.0 - 5.6 %   HbA1c POC (<> result, manual entry) 5.5 4.0 - 5.6 %   HbA1c, POC (prediabetic range) 5.5 (A) 5.7 - 6.4 %   HbA1c, POC (controlled diabetic range) 5.5 0.0 - 7.0 %     ROS 14 pt review of systems performed and negative (unless mentioned in an HPI)  Objective: BP 128/62   Pulse 77   Temp 98.1 F (36.7 C)   Wt 177 lb 9.6 oz (80.6 kg)   SpO2 97%   BMI 27.82 kg/m  Physical Exam Vitals and nursing note reviewed.  Constitutional:      General: He is not in acute distress.    Appearance: Normal appearance. He is not ill-appearing, toxic-appearing or diaphoretic.  HENT:     Head: Normocephalic and atraumatic.  Eyes:     General: No scleral icterus.       Right eye: No discharge.        Left eye: No discharge.     Extraocular Movements: Extraocular movements intact.     Pupils: Pupils are equal, round, and reactive to light.  Cardiovascular:     Rate and Rhythm: Normal rate and regular rhythm.   Pulmonary:     Effort: Pulmonary effort is normal. No respiratory distress.     Breath sounds: Normal breath sounds. No wheezing, rhonchi or rales.  Musculoskeletal:     Right lower leg: No edema.     Left lower leg: No edema.  Skin:    General: Skin is warm.     Findings: No rash.  Neurological:     Mental Status: He is alert and oriented to person, place, and time. Mental status is at baseline.  Psychiatric:        Mood and Affect: Mood normal.        Behavior: Behavior normal.        Thought Content: Thought content normal.        Judgment: Judgment normal.      Assessment/plan: KATHERINE SYME is a 83 y.o. male present for annual Chronic Conditions/illness Management Elevated PSA - referred to Uro 11/2021- Dr. Lorri Rota (following yearly-likely BPH cause) - psa UTD  Dyslipidemia/Aortic atherosclerosis (HCC)/Carotid artery disease, unspecified laterality, unspecified type (HCC) Continue Crestor 20 mg. Coronary Calcium Score: 468; Percentile: 54 Aortic atherosclerosis/plaque present. Est cardiology Labs UTD  Hypothyroidism due to Hashimoto's thyroiditis Continue levothyroxine 112 mcg daily. refills will be provided in appropriate dose based on lab result today TSH UTD  Iron deficiency Continue OTC ferrous sulfate daily  Arthritis +CCP, likely OA.  Benign prostatic hyperplasia (BPH) with straining on urination stable continue Flomax 0.4 mg twice daily  Elevated A1c: 6.0>5.9> 5.5 collected today  Return in about 6 months (around 11/08/2023) for cpe (20 min).  Orders Placed This Encounter  Procedures   POCT HgB A1C   Meds ordered this encounter  Medications   rosuvastatin (CRESTOR)  20 MG tablet    Sig: Take 1 tablet (20 mg total) by mouth daily.    Dispense:  90 tablet    Refill:  3   levothyroxine (SYNTHROID) 112 MCG tablet    Sig: Take 1 tablet (112 mcg total) by mouth daily before breakfast.    Dispense:  90 tablet    Refill:  3   tamsulosin (FLOMAX)  0.4 MG CAPS capsule    Sig: Take 1 capsule (0.4 mg total) by mouth 2 (two) times daily.    Dispense:  180 capsule    Refill:  3   Referral Orders  No referral(s) requested today    Note is dictated utilizing voice recognition software. Although note has been proof read prior to signing, occasional typographical errors still can be missed. If any questions arise, please do not hesitate to call for verification.  Electronically signed by: Napolean Backbone, DO [facility] [place]

## 2023-04-27 NOTE — Patient Instructions (Signed)
 Return in about 6 months (around 11/08/2023) for cpe (20 min).        Great to see you today.  I have refilled the medication(s) we provide.   If labs were collected or images ordered, we will inform you of  results once we have received them and reviewed. We will contact you either by echart message, or telephone call.  Please give ample time to the testing facility, and our office to run,  receive and review results. Please do not call inquiring of results, even if you can see them in your chart. We will contact you as soon as we are able. If it has been over 1 week since the test was completed, and you have not yet heard from us , then please call us .    - echart message- for normal results that have been seen by the patient already.   - telephone call: abnormal results or if patient has not viewed results in their echart.  If a referral to a specialist was entered for you, please call us  in 2 weeks if you have not heard from the specialist office to schedule.

## 2023-08-05 ENCOUNTER — Ambulatory Visit: Admitting: Family Medicine

## 2023-08-05 VITALS — BP 130/70 | HR 63 | Temp 97.5°F | Wt 168.0 lb

## 2023-08-05 DIAGNOSIS — E063 Autoimmune thyroiditis: Secondary | ICD-10-CM

## 2023-08-05 DIAGNOSIS — R194 Change in bowel habit: Secondary | ICD-10-CM | POA: Diagnosis not present

## 2023-08-05 DIAGNOSIS — R5383 Other fatigue: Secondary | ICD-10-CM

## 2023-08-05 DIAGNOSIS — E611 Iron deficiency: Secondary | ICD-10-CM

## 2023-08-05 DIAGNOSIS — R972 Elevated prostate specific antigen [PSA]: Secondary | ICD-10-CM | POA: Diagnosis not present

## 2023-08-05 NOTE — Progress Notes (Signed)
 Gary Willis , 03/09/1940, 83 y.o., male MRN: [id number] Patient Care Team    Relationship Specialty Notifications Start End  Catherine Charlies LABOR, DO PCP - General Family Medicine  11/29/21   Leslee Reusing, MD Consulting Physician Ophthalmology  11/23/18   Alvaro Ricardo KATHEE Mickey., MD Consulting Physician Urology  04/27/23   Lonni Slain, MD Consulting Physician Cardiology  04/27/23     Chief Complaint  Patient presents with   changes in bowel movement    Fatigue, progressively worse over the last 3 months     Subjective: Gary Willis is a 83 y.o. Pt presents for an OV with complaints of feeling fatigued of 3 months duration.  Associated symptoms include bowel habit changes.  Patient reports he played a round of golf and he has to rest now.  He is unable to work out the yard is much as he used to without feeling greatly fatigued.  He states that the symptoms remind him of when he was diagnosed with hypothyroidism. Patient reports his compliance with levothyroxine  112 mcg daily on empty stomach.  TSH normal in October 2024. Patient denies any shortness of breath, chest pain, dizziness or syncope. He is trying to stay well-hydrated in the heat and avoids going outside when it is too hot. He reports his bowel habit used to be that he would go once in the morning.  Now he has started to go once in the morning taking a while, then needing to have another bowel movement after eating.  Now he states the last few days he has noticed more gassy and having 3 bowel movements a day.  He has had a history of elevated PSA has remained stable and considered normal for age.  He had established with urology     02/04/2023   11:04 AM 01/29/2022    9:17 AM 01/24/2021    9:37 AM 09/12/2020   10:38 AM 01/02/2020    9:37 AM  Depression screen PHQ 2/9  Decreased Interest 0 0 0 0 0  Down, Depressed, Hopeless 0 0 0 0 0  PHQ - 2 Score 0 0 0 0 0  Altered sleeping 0      Tired, decreased  energy 0      Change in appetite 0      Feeling bad or failure about yourself  0      Trouble concentrating 0      Moving slowly or fidgety/restless 0      Suicidal thoughts 0      PHQ-9 Score 0      Difficult doing work/chores Not difficult at all        No Known Allergies Social History   Social History Narrative   Marital status/children/pets: Divorced.   Education/employment: Retired Programmer, systems, Contractor:      -smoke alarm in the home:Yes     - wears seatbelt: Yes         Past Medical History:  Diagnosis Date   Arthritis    History of fainting spells of unknown cause    Hx of cardiovascular stress test    ETT-Myoview (05/2013):  diaph atten, no ischemia, EF 63%; Low Risk   Hyperlipidemia    Migraines    Thyroid  disease    Past Surgical History:  Procedure Laterality Date   CATARACT EXTRACTION Right 04/2018   ROTATOR CUFF REPAIR Right    2021   TREATMENT FISTULA ANAL  Family History  Problem Relation Age of Onset   Miscarriages / Stillbirths Mother    Cancer Mother    Lung cancer Mother    Hypertension Father    Hyperlipidemia Father    Heart disease Father    Early death Father    Heart attack Father    High Cholesterol Father    Arthritis Sister    Diabetes Sister    Hypertension Sister    Hyperlipidemia Sister    Arthritis Sister    Cancer Maternal Grandmother    Hyperlipidemia Maternal Grandfather    Heart disease Paternal Grandmother    Heart attack Paternal Grandmother    Cancer Paternal Grandfather    Colon cancer Neg Hx    Allergies as of 08/05/2023   No Known Allergies      Medication List        Accurate as of August 05, 2023  3:22 PM. If you have any questions, ask your nurse or doctor.          aspirin  EC 81 MG tablet Take 1 tablet (81 mg total) by mouth daily. Swallow whole.   co-enzyme Q-10 30 MG capsule Take 30 mg by mouth every morning.   cyanocobalamin  1000 MCG tablet Commonly known as: VITAMIN  B12 Take 1,000 mcg by mouth daily.   FERROUS SULFATE PO Take by mouth.   glucosamine-chondroitin 500-400 MG tablet Take 1 tablet by mouth 2 (two) times daily.   levothyroxine  112 MCG tablet Commonly known as: SYNTHROID  Take 1 tablet (112 mcg total) by mouth daily before breakfast.   multivitamin with minerals tablet Take 1 tablet by mouth every morning.   rosuvastatin  20 MG tablet Commonly known as: CRESTOR  Take 1 tablet (20 mg total) by mouth daily.   tamsulosin  0.4 MG Caps capsule Commonly known as: FLOMAX  Take 1 capsule (0.4 mg total) by mouth 2 (two) times daily.   TURMERIC PO Take by mouth.        All past medical history, surgical history, allergies, family history, immunizations andmedications were updated in the EMR today and reviewed under the history and medication portions of their EMR.     ROS Negative, with the exception of above mentioned in HPI   Objective:  BP 130/70   Pulse 63   Temp (!) 97.5 F (36.4 C)   Wt 168 lb (76.2 kg)   SpO2 98%   BMI 26.31 kg/m  Body mass index is 26.31 kg/m. Physical Exam Vitals and nursing note reviewed. Exam conducted with a chaperone present.  Constitutional:      General: He is not in acute distress.    Appearance: Normal appearance. He is not ill-appearing, toxic-appearing or diaphoretic.  HENT:     Head: Normocephalic and atraumatic.  Eyes:     General: No scleral icterus.       Right eye: No discharge.        Left eye: No discharge.     Extraocular Movements: Extraocular movements intact.     Pupils: Pupils are equal, round, and reactive to light.  Neck:     Comments: No thyromegaly Cardiovascular:     Rate and Rhythm: Normal rate and regular rhythm.     Heart sounds: No murmur heard. Pulmonary:     Effort: Pulmonary effort is normal. No respiratory distress.     Breath sounds: Normal breath sounds. No wheezing, rhonchi or rales.  Abdominal:     General: Abdomen is flat. Bowel sounds are normal.  There is no  distension.     Palpations: Abdomen is soft. There is no mass.     Tenderness: There is no abdominal tenderness. There is no guarding or rebound.  Musculoskeletal:     Cervical back: Neck supple.     Right lower leg: No edema.     Left lower leg: No edema.  Skin:    General: Skin is warm.     Findings: No rash.  Neurological:     Mental Status: He is alert and oriented to person, place, and time. Mental status is at baseline.  Psychiatric:        Mood and Affect: Mood normal.        Behavior: Behavior normal.        Thought Content: Thought content normal.        Judgment: Judgment normal.      No results found. No results found. No results found for this or any previous visit (from the past 24 hours).  Assessment/Plan: Gary Willis is a 83 y.o. male present for OV for  fatigue (Primary)/hypothyroidism He is compliant with levothyroxine  112 mcg daily.  Will need for change in dose of thyroid , vitamin deficiency, iron deficiency and electrolyte panel - TSH - T4, free - Vitamin D  (25 hydroxy) - B12 and Folate Panel - Iron, TIBC and Ferritin Panel - Comp Met (CMET)  Bowel habit changes He reports symptoms are consistent when his thyroid  was under supplemented.  Will rule out thyroid  disorder today. -Consider Senokot nightly, patient not having full evacuation of stool.  There does not seem to be any red flags on exam or HPI other than increased bowel movements daily. - TSH - T4, free  Elevated PSA PSA has been just above 6, but stable and considered normal for age.  Collected today just to ensure it is remaining stable with his complaints of increased fatigue - PSA  Iron deficiency He has a history of iron deficiency.  He is taking iron supplementation. Iron panel collected today  Reviewed expectations re: course of current medical issues. Discussed self-management of symptoms. Outlined signs and symptoms indicating need for more acute  intervention. Patient verbalized understanding and all questions were answered. Patient received an After-Visit Summary.    Orders Placed This Encounter  Procedures   TSH   T4, free   Vitamin D  (25 hydroxy)   B12 and Folate Panel   Iron, TIBC and Ferritin Panel   PSA   Comp Met (CMET)   No orders of the defined types were placed in this encounter.  Referral Orders  No referral(s) requested today     Note is dictated utilizing voice recognition software. Although note has been proof read prior to signing, occasional typographical errors still can be missed. If any questions arise, please do not hesitate to call for verification.   electronically signed by:  Charlies Bellini, DO  [facility] [place]

## 2023-08-06 ENCOUNTER — Ambulatory Visit: Payer: Self-pay | Admitting: Family Medicine

## 2023-08-06 LAB — PSA: PSA: 6.7 ng/mL — ABNORMAL HIGH (ref 0.10–4.00)

## 2023-08-06 LAB — COMPREHENSIVE METABOLIC PANEL WITH GFR
AG Ratio: 1.4 (calc) (ref 1.0–2.5)
ALT: 19 U/L (ref 9–46)
AST: 22 U/L (ref 10–35)
Albumin: 4.2 g/dL (ref 3.6–5.1)
Alkaline phosphatase (APISO): 56 U/L (ref 35–144)
BUN: 16 mg/dL (ref 7–25)
CO2: 28 mmol/L (ref 20–32)
Calcium: 9.2 mg/dL (ref 8.6–10.3)
Chloride: 105 mmol/L (ref 98–110)
Creat: 0.83 mg/dL (ref 0.70–1.22)
Globulin: 3.1 g/dL (ref 1.9–3.7)
Glucose, Bld: 82 mg/dL (ref 65–99)
Potassium: 4.6 mmol/L (ref 3.5–5.3)
Sodium: 139 mmol/L (ref 135–146)
Total Bilirubin: 0.6 mg/dL (ref 0.2–1.2)
Total Protein: 7.3 g/dL (ref 6.1–8.1)
eGFR: 88 mL/min/1.73m2 (ref >=60)

## 2023-08-06 LAB — TSH: TSH: 2.78 m[IU]/L (ref 0.40–4.50)

## 2023-08-06 LAB — B12 AND FOLATE PANEL
Folate: 22.6 ng/mL
Vitamin B-12: >2000 pg/mL — ABNORMAL HIGH (ref 200–1100)

## 2023-08-06 LAB — VITAMIN D 25 HYDROXY (VIT D DEFICIENCY, FRACTURES): Vit D, 25-Hydroxy: 52 ng/mL (ref 30–100)

## 2023-08-06 LAB — IRON,TIBC AND FERRITIN PANEL
%SAT: 25 % (ref 20–48)
Ferritin: 81 ng/mL (ref 24–380)
Iron: 81 ng/mL (ref 50–380)
TIBC: 309 ug/dL (ref 250–425)

## 2023-08-06 LAB — T4, FREE: Free T4: 1.2 ng/dL (ref 0.8–1.8)

## 2023-08-10 NOTE — Telephone Encounter (Signed)
 Reason for CRM: Patient states after having lab work completed, he was advised to contact Kuneff, Renee A, DO in regards to what his next steps would be. Patient is requesting a call back, call back number is [phone]   LVM to discuss with pt.

## 2023-08-10 NOTE — Telephone Encounter (Signed)
 Pt has stopped taking mag tabs and states he has been felling better. He would like to wait until October to move forward with anything.

## 2023-11-09 ENCOUNTER — Encounter: Payer: Self-pay | Admitting: Family Medicine

## 2023-11-09 ENCOUNTER — Ambulatory Visit (INDEPENDENT_AMBULATORY_CARE_PROVIDER_SITE_OTHER): Admitting: Family Medicine

## 2023-11-09 VITALS — BP 126/68 | HR 74 | Temp 98.2°F | Ht 66.0 in | Wt 175.0 lb

## 2023-11-09 DIAGNOSIS — N401 Enlarged prostate with lower urinary tract symptoms: Secondary | ICD-10-CM

## 2023-11-09 DIAGNOSIS — E611 Iron deficiency: Secondary | ICD-10-CM | POA: Diagnosis not present

## 2023-11-09 DIAGNOSIS — I700 Atherosclerosis of aorta: Secondary | ICD-10-CM | POA: Diagnosis not present

## 2023-11-09 DIAGNOSIS — R3916 Straining to void: Secondary | ICD-10-CM

## 2023-11-09 DIAGNOSIS — I779 Disorder of arteries and arterioles, unspecified: Secondary | ICD-10-CM | POA: Diagnosis not present

## 2023-11-09 DIAGNOSIS — E063 Autoimmune thyroiditis: Secondary | ICD-10-CM

## 2023-11-09 DIAGNOSIS — Z0000 Encounter for general adult medical examination without abnormal findings: Secondary | ICD-10-CM

## 2023-11-09 DIAGNOSIS — Z131 Encounter for screening for diabetes mellitus: Secondary | ICD-10-CM

## 2023-11-09 DIAGNOSIS — E785 Hyperlipidemia, unspecified: Secondary | ICD-10-CM

## 2023-11-09 DIAGNOSIS — Z23 Encounter for immunization: Secondary | ICD-10-CM

## 2023-11-09 LAB — CBC
HCT: 46.1 % (ref 39.0–52.0)
Hemoglobin: 15.2 g/dL (ref 13.0–17.0)
MCHC: 33 g/dL (ref 30.0–36.0)
MCV: 88.5 fl (ref 78.0–100.0)
Platelets: 290 K/uL (ref 150.0–400.0)
RBC: 5.21 Mil/uL (ref 4.22–5.81)
RDW: 13.2 % (ref 11.5–15.5)
WBC: 7.6 K/uL (ref 4.0–10.5)

## 2023-11-09 LAB — LIPID PANEL
Cholesterol: 127 mg/dL (ref 0–200)
HDL: 47.3 mg/dL (ref >39.00)
LDL Cholesterol: 64 mg/dL (ref 0–99)
NonHDL: 79.58
Total CHOL/HDL Ratio: 3
Triglycerides: 80 mg/dL (ref 0.0–149.0)
VLDL: 16 mg/dL (ref 0.0–40.0)

## 2023-11-09 LAB — COMPREHENSIVE METABOLIC PANEL WITH GFR
ALT: 18 U/L (ref 0–53)
AST: 23 U/L (ref 0–37)
Albumin: 4.1 g/dL (ref 3.5–5.2)
Alkaline Phosphatase: 55 U/L (ref 39–117)
BUN: 14 mg/dL (ref 6–23)
CO2: 30 meq/L (ref 19–32)
Calcium: 8.9 mg/dL (ref 8.4–10.5)
Chloride: 102 meq/L (ref 96–112)
Creatinine, Ser: 0.8 mg/dL (ref 0.40–1.50)
GFR: 82.77 mL/min (ref >60.00)
Glucose, Bld: 99 mg/dL (ref 70–99)
Potassium: 4.3 meq/L (ref 3.5–5.1)
Sodium: 138 meq/L (ref 135–145)
Total Bilirubin: 0.5 mg/dL (ref 0.2–1.2)
Total Protein: 7.4 g/dL (ref 6.0–8.3)

## 2023-11-09 LAB — HEMOGLOBIN A1C: Hgb A1c MFr Bld: 6 % (ref 4.6–6.5)

## 2023-11-09 LAB — TSH: TSH: 2.95 u[IU]/mL (ref 0.35–5.50)

## 2023-11-09 MED ORDER — ROSUVASTATIN CALCIUM 20 MG PO TABS: 20.0000 mg | ORAL_TABLET | Freq: Every day | ORAL | 3 refills | Status: AC

## 2023-11-09 MED ORDER — TAMSULOSIN HCL 0.4 MG PO CAPS: 0.4000 mg | ORAL_CAPSULE | Freq: Two times a day (BID) | ORAL | 3 refills | Status: AC

## 2023-11-09 NOTE — Progress Notes (Signed)
 Patient ID: Gary Willis, male  DOB: 16-Oct-1940, 83 y.o.   MRN: [id number] Patient Care Team    Relationship Specialty Notifications Start End  Catherine Charlies LABOR, DO PCP - General Family Medicine  11/29/21   Leslee Reusing, MD Consulting Physician Ophthalmology  11/23/18   Alvaro Ricardo KATHEE Mickey., MD Consulting Physician Urology  04/27/23   Lonni Slain, MD Consulting Physician Cardiology  04/27/23   Lennard Lesta FALCON, MD Consulting Physician Gastroenterology  08/06/23     Chief Complaint  Patient presents with   Annual Exam    Pt is fasting.  Chronic condition management Influenza vaccine- given    Subjective: Gary Willis is a 83 y.o.  male present for CPE and chronic Conditions/illness Management All past medical history, surgical history, allergies, family history, immunizations, medications and social history were updated in the electronic medical record today. All recent labs, ED visits and hospitalizations within the last year were reviewed.  Health maintenance:  Immunizations: tdap UTD 07/2019, influenza vaccine-administered today.  shingrix series completed. PNA completed.  Eye exams completed with ophthalmology yearly  Dyslipidemia Patient has a history of elevated cholesterol.  He is compliant with Crestor  20 mg daily.  family history of heart disease in his father and paternal grandmother.  He reports he has a history of carotid artery disease.  Last available ultrasound in 2018, showed less than 50% stenosis in the bilateral carotid arteries. Coronary Calcium  Score: 468 (12/2021) Percentile: 54 Aortic atherosclerosis/plaque present.  hypothyroidism/iron deficiency Compliant  w/ levo and iron.  Prior note: He is compliant with levothyroxine  112 mcg daily. He does take a multivitamin.  Arthritis Likely OA- est rheum. +CCP, other labs normal . Prior note; Reports he has noticed worsening bilateral hand arthritis and swelling.  States is becoming more  uncomfortable with time and is making it more difficult to remain active with his hands.  He has never been tested for types of arthritis in the past.  His sisters both have arthritis, uncertain type.  Benign prostatic hyperplasia (BPH) /elevated PSA Est with uro fir elevated psa- felt to be BPH causes. Dr. Alvaro  Prior note: Reports he has had a mildly elevated PSA for decades.  It has gone unchanged.  He does experience BPH symptoms that is improved with Flomax  0.4 mg twice daily.      11/09/2023    8:53 AM 02/04/2023   11:04 AM 01/29/2022    9:17 AM 01/24/2021    9:37 AM 09/12/2020   10:38 AM  Depression screen PHQ 2/9  Decreased Interest 0 0 0 0 0  Down, Depressed, Hopeless 0 0 0 0 0  PHQ - 2 Score 0 0 0 0 0  Altered sleeping 0 0     Tired, decreased energy 0 0     Change in appetite 0 0     Feeling bad or failure about yourself  0 0     Trouble concentrating 0 0     Moving slowly or fidgety/restless 0 0     Suicidal thoughts 0 0     PHQ-9 Score 0 0     Difficult doing work/chores Not difficult at all Not difficult at all         11/09/2023    8:53 AM  GAD 7 : Generalized Anxiety Score  Nervous, Anxious, on Edge 0  Control/stop worrying 0  Worry too much - different things 0  Trouble relaxing 0  Restless 0  Easily annoyed or  irritable 0  Afraid - awful might happen 0  Total GAD 7 Score 0  Anxiety Difficulty Not difficult at all          11/09/2023    8:53 AM 02/04/2023   10:59 AM 05/20/2022    8:57 AM 01/29/2022    9:17 AM 01/24/2021    9:43 AM  Fall Risk   Falls in the past year? 0 0 0 0 0  Number falls in past yr: 0 0 0 0 0  Injury with Fall? 0 0 0 0 0  Risk for fall due to :    No Fall Risks Impaired vision  Follow up Falls evaluation completed Falls evaluation completed;Education provided;Falls prevention discussed Falls evaluation completed Falls evaluation completed  Falls prevention discussed      Data saved with a previous flowsheet row definition     Immunization History  Administered Date(s) Administered   Fluad Quad(high Dose 65+) 09/14/2018, 10/14/2019, 11/05/2021   Fluad Trivalent(High Dose 65+) 11/07/2022   INFLUENZA, HIGH DOSE SEASONAL PF 11/27/2016, 11/09/2023   Influenza,inj,Quad PF,6+ Mos 11/05/2021   Influenza-Unspecified 10/13/2011, 11/08/2013, 12/06/2014, 10/02/2015, 11/27/2016, 09/24/2017, 10/14/2017, 11/12/2020   PFIZER(Purple Top)SARS-COV-2 Vaccination 02/18/2019, 03/15/2019   Pneumococcal Conjugate-13 06/15/2013   Pneumococcal Polysaccharide-23 10/06/2008   Td 10/13/1997, 10/06/2008, 07/22/2019   Td (Adult),5 Lf Tetanus Toxid, Preservative Free 07/22/2019   Zoster Recombinant(Shingrix) 07/22/2019, 12/31/2019   Zoster, Live 09/09/2010    No results found.  Past Medical History:  Diagnosis Date   Arthritis    History of fainting spells of unknown cause    Hx of cardiovascular stress test    ETT-Myoview (05/2013):  diaph atten, no ischemia, EF 63%; Low Risk   Hyperlipidemia    Migraines    Thyroid  disease    No Known Allergies Past Surgical History:  Procedure Laterality Date   CATARACT EXTRACTION Right 04/2018   ROTATOR CUFF REPAIR Right    2021   TREATMENT FISTULA ANAL     Family History  Problem Relation Age of Onset   Miscarriages / Stillbirths Mother    Cancer Mother    Lung cancer Mother    Hypertension Father    Hyperlipidemia Father    Heart disease Father    Early death Father    Heart attack Father    High Cholesterol Father    Arthritis Sister    Diabetes Sister    Hypertension Sister    Hyperlipidemia Sister    Arthritis Sister    Cancer Maternal Grandmother    Hyperlipidemia Maternal Grandfather    Heart disease Paternal Grandmother    Heart attack Paternal Grandmother    Cancer Paternal Grandfather    Colon cancer Neg Hx    Social History   Social History Narrative   Marital status/children/pets: Divorced.   Education/employment: Retired programmer, systems, Museum/gallery Exhibitions Officer:      -smoke alarm in the home:Yes     - wears seatbelt: Yes          Allergies as of 11/09/2023   No Known Allergies      Medication List        Accurate as of November 09, 2023  9:08 AM. If you have any questions, ask your nurse or doctor.          aspirin  EC 81 MG tablet Take 1 tablet (81 mg total) by mouth daily. Swallow whole.   co-enzyme Q-10 30 MG capsule Take 30 mg by mouth every morning.   cyanocobalamin  1000  MCG tablet Commonly known as: VITAMIN B12 Take 1,000 mcg by mouth daily.   FERROUS SULFATE PO Take by mouth.   glucosamine-chondroitin 500-400 MG tablet Take 1 tablet by mouth 2 (two) times daily.   levothyroxine  112 MCG tablet Commonly known as: SYNTHROID  Take 1 tablet (112 mcg total) by mouth daily before breakfast.   multivitamin with minerals tablet Take 1 tablet by mouth every morning.   rosuvastatin  20 MG tablet Commonly known as: CRESTOR  Take 1 tablet (20 mg total) by mouth daily.   tamsulosin  0.4 MG Caps capsule Commonly known as: FLOMAX  Take 1 capsule (0.4 mg total) by mouth 2 (two) times daily.   TURMERIC PO Take by mouth.        All past medical history, surgical history, allergies, family history, immunizations andmedications were updated in the EMR today and reviewed under the history and medication portions of their EMR.    No results found for this or any previous visit (from the past 2160 hours).    Review of Systems  All other systems reviewed and are negative.  14 pt review of systems performed and negative (unless mentioned in an HPI)  Objective: BP 126/68   Pulse 74   Temp 98.2 F (36.8 C)   Ht 5' 6 (1.676 m)   Wt 175 lb (79.4 kg)   SpO2 97%   BMI 28.25 kg/m  Physical Exam Vitals and nursing note reviewed. Exam conducted with a chaperone present.  Constitutional:      General: He is not in acute distress.    Appearance: Normal appearance. He is not ill-appearing, toxic-appearing or  diaphoretic.  HENT:     Head: Normocephalic and atraumatic.     Right Ear: Tympanic membrane, ear canal and external ear normal. There is no impacted cerumen.     Left Ear: Tympanic membrane, ear canal and external ear normal. There is no impacted cerumen.     Nose: Nose normal. No congestion or rhinorrhea.     Mouth/Throat:     Mouth: Mucous membranes are moist.     Pharynx: Oropharynx is clear. No oropharyngeal exudate or posterior oropharyngeal erythema.  Eyes:     General: No scleral icterus.       Right eye: No discharge.        Left eye: No discharge.     Extraocular Movements: Extraocular movements intact.     Pupils: Pupils are equal, round, and reactive to light.  Cardiovascular:     Rate and Rhythm: Normal rate and regular rhythm.     Pulses: Normal pulses.     Heart sounds: Normal heart sounds. No murmur heard.    No friction rub. No gallop.  Pulmonary:     Effort: Pulmonary effort is normal. No respiratory distress.     Breath sounds: Normal breath sounds. No stridor. No wheezing, rhonchi or rales.  Chest:     Chest wall: No tenderness.  Abdominal:     General: Abdomen is flat. Bowel sounds are normal. There is no distension.     Palpations: Abdomen is soft. There is no mass.     Tenderness: There is no abdominal tenderness. There is no right CVA tenderness, left CVA tenderness, guarding or rebound.     Hernia: No hernia is present.  Musculoskeletal:        General: No swelling or tenderness. Normal range of motion.     Cervical back: Normal range of motion and neck supple.     Right lower leg:  No edema.     Left lower leg: No edema.  Lymphadenopathy:     Cervical: No cervical adenopathy.  Skin:    General: Skin is warm and dry.     Coloration: Skin is not jaundiced.     Findings: No bruising, lesion or rash.  Neurological:     General: No focal deficit present.     Mental Status: He is alert and oriented to person, place, and time. Mental status is at baseline.      Cranial Nerves: No cranial nerve deficit.     Sensory: No sensory deficit.     Motor: No weakness.     Coordination: Coordination normal.     Gait: Gait normal.     Deep Tendon Reflexes: Reflexes normal.  Psychiatric:        Mood and Affect: Mood normal.        Behavior: Behavior normal.        Thought Content: Thought content normal.        Judgment: Judgment normal.      Assessment/plan: DECLYN DELSOL is a 83 y.o. male present for CPE and chronic Conditions/illness Management Elevated PSA - referred to Uro 11/2021- Dr. Patrcia (following yearly-likely BPH cause)  Dyslipidemia/Aortic atherosclerosis (HCC)/Carotid artery disease, unspecified laterality, unspecified type (HCC) Continue Crestor  20 mg. Coronary Calcium  Score: 468; Percentile: 54 Aortic atherosclerosis/plaque present. Est cardiology Labs collected today  Hypothyroidism due to Hashimoto's thyroiditis Continue levothyroxine  112 mcg daily. refills will be provided in appropriate dose based on lab result today TSH, T4 free collected today  Iron deficiency Continue OTC ferrous sulfate daily Iron panel collected today  Arthritis +CCP, likely OA.  Benign prostatic hyperplasia (BPH) with straining on urination Stable Continue Flomax  0.4 mg twice daily  Elevated A1c: 6.0>5.9> 5.5> collected today collected today  Influenza vaccine needed - Flu vaccine HIGH DOSE PF(Fluzone Trivalent Diabetes mellitus screening - Hemoglobin A1c  Routine general medical examination at a health care facility (Primary) Immunizations: tdap UTD 07/2019, influenza vaccine-administered today.  shingrix series completed. PNA completed.  Eye exams completed with ophthalmology yearly Patient was encouraged to exercise greater than 150 minutes a week. Patient was encouraged to choose a diet filled with fresh fruits and vegetables, and lean meats. AVS provided to patient today for education/recommendation on gender specific health and  safety maintenance.   No follow-ups on file.  Orders Placed This Encounter  Procedures   Flu vaccine HIGH DOSE PF(Fluzone Trivalent)   CBC   Comprehensive metabolic panel with GFR   Hemoglobin A1c   TSH   Lipid panel   Meds ordered this encounter  Medications   rosuvastatin  (CRESTOR ) 20 MG tablet    Sig: Take 1 tablet (20 mg total) by mouth daily.    Dispense:  90 tablet    Refill:  3   tamsulosin  (FLOMAX ) 0.4 MG CAPS capsule    Sig: Take 1 capsule (0.4 mg total) by mouth 2 (two) times daily.    Dispense:  180 capsule    Refill:  3   Referral Orders  No referral(s) requested today    Note is dictated utilizing voice recognition software. Although note has been proof read prior to signing, occasional typographical errors still can be missed. If any questions arise, please do not hesitate to call for verification.  Electronically signed by: Charlies Bellini, DO [facility] [place]

## 2023-11-09 NOTE — Patient Instructions (Signed)

## 2023-11-10 ENCOUNTER — Ambulatory Visit: Payer: Self-pay | Admitting: Family Medicine

## 2023-11-10 MED ORDER — LEVOTHYROXINE SODIUM 112 MCG PO TABS: 112.0000 ug | ORAL_TABLET | Freq: Every day | ORAL | 3 refills | Status: AC

## 2024-03-30 ENCOUNTER — Ambulatory Visit
